# Patient Record
Sex: Female | Born: 1975 | Race: Black or African American | Hispanic: No | Marital: Single | Smoking: Current every day smoker
Health system: Southern US, Community
[De-identification: ages and names within clinical notes are randomized; demographics above are authoritative.]

## PROBLEM LIST (undated history)

## (undated) DIAGNOSIS — I10 Essential (primary) hypertension: Secondary | ICD-10-CM

## (undated) DIAGNOSIS — F32A Depression, unspecified: Secondary | ICD-10-CM

## (undated) DIAGNOSIS — E785 Hyperlipidemia, unspecified: Secondary | ICD-10-CM

## (undated) DIAGNOSIS — F419 Anxiety disorder, unspecified: Secondary | ICD-10-CM

## (undated) HISTORY — DX: Hyperlipidemia, unspecified: E78.5

## (undated) HISTORY — PX: DILATION AND CURETTAGE OF UTERUS: SHX78

## (undated) HISTORY — DX: Anxiety disorder, unspecified: F41.9

## (undated) HISTORY — DX: Depression, unspecified: F32.A

## (undated) HISTORY — DX: Essential (primary) hypertension: I10

---

## 2006-08-07 ENCOUNTER — Other Ambulatory Visit: Admission: RE | Admit: 2006-08-07 | Discharge: 2006-08-07 | Payer: Self-pay | Admitting: Obstetrics and Gynecology

## 2006-10-07 ENCOUNTER — Ambulatory Visit (HOSPITAL_COMMUNITY): Admission: RE | Admit: 2006-10-07 | Discharge: 2006-10-07 | Payer: Self-pay | Admitting: Obstetrics and Gynecology

## 2007-02-25 ENCOUNTER — Inpatient Hospital Stay (HOSPITAL_COMMUNITY): Admission: AD | Admit: 2007-02-25 | Discharge: 2007-02-25 | Payer: Self-pay | Admitting: Obstetrics and Gynecology

## 2007-03-14 ENCOUNTER — Encounter (INDEPENDENT_AMBULATORY_CARE_PROVIDER_SITE_OTHER): Payer: Self-pay | Admitting: Obstetrics and Gynecology

## 2007-03-14 ENCOUNTER — Inpatient Hospital Stay (HOSPITAL_COMMUNITY): Admission: AD | Admit: 2007-03-14 | Discharge: 2007-03-16 | Payer: Self-pay | Admitting: Obstetrics and Gynecology

## 2008-01-09 ENCOUNTER — Other Ambulatory Visit: Admission: RE | Admit: 2008-01-09 | Discharge: 2008-01-09 | Payer: Self-pay | Admitting: Obstetrics and Gynecology

## 2008-08-05 ENCOUNTER — Inpatient Hospital Stay (HOSPITAL_COMMUNITY): Admission: AD | Admit: 2008-08-05 | Discharge: 2008-08-07 | Payer: Self-pay | Admitting: Obstetrics and Gynecology

## 2009-01-05 ENCOUNTER — Other Ambulatory Visit: Admission: RE | Admit: 2009-01-05 | Discharge: 2009-01-05 | Payer: Self-pay | Admitting: Obstetrics and Gynecology

## 2010-10-03 LAB — CBC
MCHC: 33.4 g/dL (ref 30.0–36.0)
Platelets: 237 10*3/uL (ref 150–400)
RBC: 3.71 MIL/uL — ABNORMAL LOW (ref 3.87–5.11)
RDW: 14.8 % (ref 11.5–15.5)
WBC: 9 10*3/uL (ref 4.0–10.5)

## 2010-10-03 LAB — RPR: RPR Ser Ql: NONREACTIVE

## 2010-10-31 NOTE — H&P (Signed)
NAMECEOLA, PARA NO.:  [id number]   MEDICAL RECORD NO.:  [id number]           PATIENT TYPE:   LOCATION:                                FACILITY:  [facility]   PHYSICIAN:  Charles A. Delcambre, MDDATE OF BIRTH:  [birth year]   DATE OF ADMISSION:  08/05/2008  DATE OF DISCHARGE:                              HISTORY & PHYSICAL   This patient will be admitted on August 05, 2008 at 7:30 p.m for  Cytotec induction secondary to increasing estimated fetal weight and  elective nature.  EDC is August 06, 2008, confirmed with 14-week  ultrasound.  She is gravida 4, para 1-0-2-1.  Last baby was 8 pounds and  baby today on ultrasound measured large and ultrasound said 9 pounds 3  ounces, 4182 grams.   SURGICAL HISTORY:  D&C in the past, SVD.   MEDICATIONS:  Prenatal vitamins, iron.   ALLERGIES:  No known drug allergies.   SOCIAL HISTORY:  No tobacco, ethanol, drug use.  The patient married and  stays with her husband.   REVIEW OF SYSTEMS:  Denies contractions, ruptured membranes, or  bleeding.  She notes rapid fetal movement at this time.   PHYSICAL EXAMINATION:  HEART:  Regular rate and rhythm.  LUNGS:  Clear bilaterally.  ABDOMEN:  Gravid, fundal height is 45.  She will be 40 weeks on date of  induction , August 06, 2008, after Cytotec the evening before.  Ultrasound did show the baby to be 4182 grams.  First baby did suffer  from chronic aspiration syndrome and almost got to ECMO.  On physical  exam, cervix 1 cm posterior median, abdominal length 44 cm, having  contractions and baby pressing very much forward and at the time of this  measurement with the ultrasound is as noted above.  EXTREMITIES:  Moderate edema bilaterally.   ASSESSMENT:  1. Intrauterine pregnancy, 41 weeks.  2. Macrosomia.   PLAN:  Cytotec induction, if  unable, Pitocin thereafter.      Charles A. Sydnee Cabal, MD  Electronically Signed     CAD/MEDQ  D:  07/29/2008  T:  07/30/2008   Job:  [id number]

## 2010-10-31 NOTE — H&P (Signed)
NAMELORAL, CAMPI NO.:  [id number]   MEDICAL RECORD NO.:  [id number]          PATIENT TYPE:  INP   LOCATION:                                [facility]ACILITY:  [facility]   PHYSICIAN:  Charles A. Delcambre, MDDATE OF BIRTH:  [birth year]   DATE OF ADMISSION:  DATE OF DISCHARGE:                              HISTORY & PHYSICAL   This patient will be admitted on March 14, 2007, at 9:45 p.m. to  undergo Cervidil induction secondary to post dates.  She will be 10 days  overdue on date of delivery.  Prenatal care has been complicated by  first-trimester bleeding, bacterial vaginosis and anemia.  She is a 35-  year-old gravida 3, para 0-0-2-0; [facility] March 05, 2007.   PAST MEDICAL HISTORY:  None.   SURGICAL HISTORY:  D&C in 2003 for spontaneous abortion.  Spontaneous  abortion at 6 weeks in 1997, no D&C required.   MEDICATIONS:  1. Prenatal vitamins.  2. Tandem DHA.  3. Flagyl 500 b.i.d. in the past, Flagyl 2 g.  4. Iron once a day.   ALLERGIES:  No known drug allergies.   SOCIAL HISTORY:  Married in a monogamous relationship with her husband.  No tobacco at this time, alcohol or drug use.   REVIEW OF SYSTEMS:  Denies contractions, ruptured membranes or bleeding  at this time.  She notes active fetal movement.  No vision changes,  right upper quadrant pain, scotomata, or headache.   PHYSICAL EXAMINATION:  Alert and oriented x3, no distress.  Blood  pressure 122/70, weight 260 pounds, respirations 20, pulse 90.  HEENT:  Grossly within normal limits.  NECK:  Supple without thyromegaly or adenopathy.  LUNGS:  Clear bilaterally.  BREASTS:  No masses, tenderness, discharge, skin or nipple change  bilaterally.  HEART:  Regular rate and rhythm, 2/6 systolic ejection murmur left  sternal border.  Fundal height 40 cm.  PELVIC:  Cervix posterior, soft, minimally effaced.   ASSESSMENT:  Intrauterine pregnancy 10 days overdue, admitted now for  Cervidil induction.   PLAN:   Cervidil, then high-dose Pitocin in the morning.  Shower if not  in labor.  Breakfast okay if not in labor.  All questions are answered.  She will follow up as directed.      Charles A. Sydnee Cabal, MD  Electronically Signed     CAD/MEDQ  D:  03/06/2007  T:  03/06/2007  Job:  [id number]

## 2011-03-29 LAB — CBC
HCT: 24.6 — ABNORMAL LOW
HCT: 32.9 — ABNORMAL LOW
Hemoglobin: 11 — ABNORMAL LOW
Hemoglobin: 8.5 — ABNORMAL LOW
MCV: 87.1
RBC: 3.78 — ABNORMAL LOW
RDW: 16.1 — ABNORMAL HIGH
WBC: 10.2

## 2011-03-29 LAB — RPR: RPR Ser Ql: NONREACTIVE

## 2011-03-30 LAB — CBC
Hemoglobin: 10.2 — ABNORMAL LOW
MCHC: 34.1
RDW: 15.2 — ABNORMAL HIGH

## 2011-03-30 LAB — KLEIHAUER-BETKE STAIN: Quantitation Fetal Hemoglobin: 0

## 2014-10-16 ENCOUNTER — Encounter (HOSPITAL_COMMUNITY): Payer: Self-pay | Admitting: Emergency Medicine

## 2014-10-16 ENCOUNTER — Emergency Department (HOSPITAL_COMMUNITY)
Admission: EM | Admit: 2014-10-16 | Discharge: 2014-10-16 | Disposition: A | Discharge status: Home / Self Care | Payer: 59 | Source: Home / Self Care | Attending: Family Medicine | Admitting: Family Medicine

## 2014-10-16 DIAGNOSIS — L0231 Cutaneous abscess of buttock: Secondary | ICD-10-CM

## 2014-10-16 DIAGNOSIS — R21 Rash and other nonspecific skin eruption: Secondary | ICD-10-CM

## 2014-10-16 MED ORDER — SULFAMETHOXAZOLE-TRIMETHOPRIM 800-160 MG PO TABS: 2.0000 | ORAL_TABLET | Freq: Two times a day (BID) | ORAL | Status: AC

## 2014-10-16 NOTE — ED Notes (Signed)
Patient reports generalized, red rash to skin.  Appears on torso and extremities and itches.  Patient also has a draining wound to right buttocks.  Onset 4/26.  Has been using warm compresses and boileze.

## 2014-10-16 NOTE — Discharge Instructions (Signed)
Come back here in 2 days so we can reevaluate your medical condition and your rash.  If you start to get worse and develop symptoms including fever, vomiting, diarrhea, large blisters, or if the rash begins to involve your lips and mouth, or if you begin to feel very ill, go to the emergency department.  Abscess An abscess is an infected area that contains a collection of pus and debris.It can occur in almost any part of the body. An abscess is also known as a furuncle or boil. CAUSES  An abscess occurs when tissue gets infected. This can occur from blockage of oil or sweat glands, infection of hair follicles, or a minor injury to the skin. As the body tries to fight the infection, pus collects in the area and creates pressure under the skin. This pressure causes pain. People with weakened immune systems have difficulty fighting infections and get certain abscesses more often.  SYMPTOMS Usually an abscess develops on the skin and becomes a painful mass that is red, warm, and tender. If the abscess forms under the skin, you may feel a moveable soft area under the skin. Some abscesses break open (rupture) on their own, but most will continue to get worse without care. The infection can spread deeper into the body and eventually into the bloodstream, causing you to feel ill.  DIAGNOSIS  Your caregiver will take your medical history and perform a physical exam. A sample of fluid may also be taken from the abscess to determine what is causing your infection. TREATMENT  Your caregiver may prescribe antibiotic medicines to fight the infection. However, taking antibiotics alone usually does not cure an abscess. Your caregiver may need to make a small cut (incision) in the abscess to drain the pus. In some cases, gauze is packed into the abscess to reduce pain and to continue draining the area. HOME CARE INSTRUCTIONS   Only take over-the-counter or prescription medicines for pain, discomfort, or fever as  directed by your caregiver.  If you were prescribed antibiotics, take them as directed. Finish them even if you start to feel better.  If gauze is used, follow your caregiver's directions for changing the gauze.  To avoid spreading the infection:  Keep your draining abscess covered with a bandage.  Wash your hands well.  Do not share personal care items, towels, or whirlpools with others.  Avoid skin contact with others.  Keep your skin and clothes clean around the abscess.  Keep all follow-up appointments as directed by your caregiver. SEEK MEDICAL CARE IF:   You have increased pain, swelling, redness, fluid drainage, or bleeding.  You have muscle aches, chills, or a general ill feeling.  You have a fever. MAKE SURE YOU:   Understand these instructions.  Will watch your condition.  Will get help right away if you are not doing well or get worse. Document Released: 03/14/2005 Document Revised: 12/04/2011 Document Reviewed: 08/17/2011 [facility] Patient Information 2015 [place], [place]. This information is not intended to replace advice given to you by your health care provider. Make sure you discuss any questions you have with your health care provider.

## 2014-10-16 NOTE — ED Provider Notes (Signed)
CSN: [id number]     Arrival date & time 10/16/14  7078 History   First MD Initiated Contact with Patient 10/16/14 [phone]     Chief Complaint  Patient presents with  . Abscess  . Rash   (Consider location/radiation/quality/duration/timing/severity/associated sxs/prior Treatment) HPI      39 year old female presents for evaluation of an abscess on her right buttock. This started about 5 days ago. It has been draining for the past 3 days. Also she noted a rash 2 days ago. It is mildly itchy and spread out over her entire trunk. She denies any other symptoms including no fever, chills, NVD, abdominal pain, weakness, lightheadedness. The rash does not involve her hands or feet. There is no rash around her lips or mouth. Does not feel ill in any way.  History reviewed. No pertinent past medical history. Past Surgical History  Procedure Laterality Date  . Dilation and curettage of uterus     No family history on file. History  Substance Use Topics  . Smoking status: Current Every Day Smoker  . Smokeless tobacco: Not on file  . Alcohol Use: Yes   OB History    No data available     Review of Systems  Skin: Positive for rash.       Abscess of right buttock, draining  All other systems reviewed and are negative.   Allergies  Review of patient's allergies indicates no known allergies.  Home Medications   Prior to Admission medications   Medication Sig Start Date End Date Taking? Authorizing Provider  OVER THE COUNTER MEDICATION Boil eze   Yes Historical Provider, MD  sulfamethoxazole-trimethoprim (BACTRIM DS,SEPTRA DS) 800-160 MG per tablet Take 2 tablets by mouth 2 (two) times daily. 10/16/14 10/23/14  Freeman Caldron Siena Poehler, PA-C   BP 128/83 mmHg  Pulse 77  Temp(Src) 98.6 F (37 C) (Oral)  Resp 16  SpO2 99%  LMP 10/16/2014 Physical Exam  Constitutional: She is oriented to person, place, and time. Vital signs are normal. She appears well-developed and well-nourished. No distress.  HENT:   Head: Normocephalic and atraumatic.  Pulmonary/Chest: Effort normal. No respiratory distress.  Genitourinary:     Neurological: She is alert and oriented to person, place, and time. She has normal strength. Coordination normal.  Skin: Skin is warm and dry. Rash noted. Rash is macular (erythematous macular rash, symmetric on the entire trunk, blanches, with a slight scale. Negative nikoskys sign, and a vesicles or bulla). She is not diaphoretic.  Psychiatric: She has a normal mood and affect. Judgment normal.  Nursing note and vitals reviewed.   ED Course  Procedures (including critical care time) Labs Review Labs Reviewed - No data to display  Imaging Review No results found.   MDM   1. Abscess of buttock   2. Rash     Advised sitz baths for the abscess. Watchful waiting for the rash. She has no GI symptoms, no mucosal involvement. There is no bulla formation and Nikolsky is negative. Therefore, do not suspect toxic shock syndrome at this time. She will follow-up again in 2 days to recheck, strict return precautions were given that would prompt her to return to the emergency department.  Meds ordered this encounter  Medications  . OVER THE COUNTER MEDICATION    Sig: Boil eze  . sulfamethoxazole-trimethoprim (BACTRIM DS,SEPTRA DS) 800-160 MG per tablet    Sig: Take 2 tablets by mouth 2 (two) times daily.    Dispense:  40 tablet    Refill:  0    Order Specific Question:  Supervising Provider    Answer:  Ihor Gully D [[id number]]      Liam Graham, PA-C 10/16/14 1052

## 2014-10-18 ENCOUNTER — Emergency Department (HOSPITAL_COMMUNITY)
Admission: EM | Admit: 2014-10-18 | Discharge: 2014-10-18 | Disposition: A | Discharge status: Home / Self Care | Payer: 59 | Source: Home / Self Care | Attending: Family Medicine | Admitting: Family Medicine

## 2014-10-18 ENCOUNTER — Encounter (HOSPITAL_COMMUNITY): Payer: Self-pay | Admitting: Emergency Medicine

## 2014-10-18 DIAGNOSIS — L081 Erythrasma: Secondary | ICD-10-CM

## 2014-10-18 MED ORDER — CLINDAMYCIN PHOSPHATE 1 % EX GEL: Freq: Two times a day (BID) | CUTANEOUS | Status: DC

## 2014-10-18 MED ORDER — CLARITHROMYCIN 500 MG PO TABS: 1000.0000 mg | ORAL_TABLET | Freq: Once | ORAL | Status: DC

## 2014-10-18 NOTE — ED Provider Notes (Signed)
Lindsay Knox is a 39 y.o. female who presents to Urgent Care today for rash. Patient was seen 2 days ago for an abscess. At the same time of the abscess she had a rash on her trunk. She notes that the rash is progressed. She notes a scaly mildly itchy rash on her trunk and now on her extremities. No oral lesions fevers or chills nausea vomiting or diarrhea. The rash preceded the antibiotics that she is currently taking. The abscess feels much better.   History reviewed. No pertinent past medical history. Past Surgical History  Procedure Laterality Date  . Dilation and curettage of uterus     History  Substance Use Topics  . Smoking status: Current Every Day Smoker  . Smokeless tobacco: Not on file  . Alcohol Use: Yes   ROS as above Medications: No current facility-administered medications for this encounter.   Current Outpatient Prescriptions  Medication Sig Dispense Refill  . clarithromycin (BIAXIN) 500 MG tablet Take 2 tablets (1,000 mg total) by mouth once. 2 tablet 0  . clindamycin (CLINDAGEL) 1 % gel Apply topically 2 (two) times daily. 60 g 1  . OVER THE COUNTER MEDICATION Boil eze    . sulfamethoxazole-trimethoprim (BACTRIM DS,SEPTRA DS) 800-160 MG per tablet Take 2 tablets by mouth 2 (two) times daily. 40 tablet 0   No Known Allergies   Exam:  BP 129/54 mmHg  Pulse 70  Temp(Src) 98.7 F (37.1 C) (Oral)  Resp 16  SpO2 100%  LMP 10/16/2014 Gen: Well NAD Skin: Macular scaly rash on trunk and extremities. Mild hyperpigmented. Nontender.Coral colored illumination under Woods lamp.  No results found for this or any previous visit (from the past 24 hour(s)). No results found.  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 Treat with oral Biaxin 1 g once and clindamycin gel.  Discussed warning signs or symptoms. Please see discharge instructions. Patient expresses understanding.     Gregor Hams, MD 10/18/14 [phone]

## 2014-10-18 NOTE — ED Notes (Signed)
C/o rash States rash is all over body States she was seen here and was told to follow up

## 2014-10-18 NOTE — Discharge Instructions (Signed)
Thank you for coming in today. 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 Take the clarithromycin 2 tabs once. Use clindamycin gel on the rash. Return as needed Continue Bactrim

## 2017-12-24 ENCOUNTER — Ambulatory Visit: Payer: 59 | Admitting: Family Medicine

## 2017-12-24 ENCOUNTER — Encounter: Payer: Self-pay | Admitting: Family Medicine

## 2017-12-24 ENCOUNTER — Other Ambulatory Visit: Payer: Self-pay

## 2017-12-24 VITALS — BP 132/86 | HR 95 | Temp 98.0°F | Resp 16 | Ht 71.26 in | Wt 263.0 lb

## 2017-12-24 DIAGNOSIS — Z131 Encounter for screening for diabetes mellitus: Secondary | ICD-10-CM | POA: Diagnosis not present

## 2017-12-24 DIAGNOSIS — N912 Amenorrhea, unspecified: Secondary | ICD-10-CM

## 2017-12-24 DIAGNOSIS — Z124 Encounter for screening for malignant neoplasm of cervix: Secondary | ICD-10-CM

## 2017-12-24 DIAGNOSIS — Z0000 Encounter for general adult medical examination without abnormal findings: Secondary | ICD-10-CM | POA: Diagnosis not present

## 2017-12-24 DIAGNOSIS — Z13220 Encounter for screening for lipoid disorders: Secondary | ICD-10-CM

## 2017-12-24 DIAGNOSIS — E6609 Other obesity due to excess calories: Secondary | ICD-10-CM | POA: Diagnosis not present

## 2017-12-24 DIAGNOSIS — Z1231 Encounter for screening mammogram for malignant neoplasm of breast: Secondary | ICD-10-CM | POA: Diagnosis not present

## 2017-12-24 DIAGNOSIS — Z6836 Body mass index (BMI) 36.0-36.9, adult: Secondary | ICD-10-CM

## 2017-12-24 LAB — POCT WET + KOH PREP
Trich by wet prep: ABSENT
YEAST BY KOH: ABSENT
YEAST BY WET PREP: ABSENT

## 2017-12-24 MED ORDER — METRONIDAZOLE 500 MG PO TABS: 500.0000 mg | ORAL_TABLET | Freq: Two times a day (BID) | ORAL | 0 refills | Status: DC

## 2017-12-24 NOTE — Patient Instructions (Addendum)
  Z30.433 is the ICD [place] IUD REMOVAL AND REINSERTION Please check with [place] about coverage Do the cover the drug and do they cover the procedure   IF you received an x-ray today, you will receive an invoice from [facility]. Please contact [facility] at [phone] with questions or concerns regarding your invoice.   IF you received labwork today, you will receive an invoice from [facility]. Please contact [facility] at [phone] with questions or concerns regarding your invoice.   Our billing staff will not be able to assist you with questions regarding bills from these companies.  You will be contacted with the lab results as soon as they are available. The fastest way to get your results is to activate your My Chart account. Instructions are located on the last page of this paperwork. If you have not heard from [place] regarding the results in 2 weeks, please contact this office.     We recommend that you schedule a mammogram for breast cancer screening. Typically, you do not need a referral to do this. Please contact a local imaging center to schedule your mammogram.  [facility] - [phone]  *ask for the Radiology Department [facility] ([place]) - [phone] or [phone]  [facility] - [phone] [place] [phone] [facility] - [phone]  *ask for the [facility] - [phone]  *ask for the Radiology Department [facility] - [phone]  *ask for the [place] - [phone]

## 2017-12-24 NOTE — Progress Notes (Signed)
Chief Complaint  Patient presents with  . Establish Care    Subjective:  Lindsay Knox is a 42 y.o. female here for a health maintenance visit.  Patient is new pt  Pt reports that her IUD has been in place for 10 years She has no periods in over 8 years She states that she has no hot flashes or mood swings   She has not had a physical in a long time   There are no active problems to display for this patient.   History reviewed. No pertinent past medical history.  Past Surgical History:  Procedure Laterality Date  . DILATION AND CURETTAGE OF UTERUS       Outpatient Medications Prior to Visit  Medication Sig Dispense Refill  . clarithromycin (BIAXIN) 500 MG tablet Take 2 tablets (1,000 mg total) by mouth once. 2 tablet 0  . clindamycin (CLINDAGEL) 1 % gel Apply topically 2 (two) times daily. 60 g 1  . OVER THE COUNTER MEDICATION Boil eze     No facility-administered medications prior to visit.     No Known Allergies   Family History  Problem Relation Age of Onset  . Diabetes Father   . Hypertension Father   . Cancer Father        kidney cancer  . Diabetes Brother   . Hypertension Brother   . Hypertension Maternal Grandmother   . Stroke Maternal Grandmother   . Diabetes Paternal Grandmother   . Hypertension Paternal Grandmother      Health Habits: Dental Exam: up to date Eye Exam: up to date Exercise: 0 times/week on average Current exercise activities: walking/running Diet:   Social History   Socioeconomic History  . Marital status: Single    Spouse name: Not on file  . Number of children: Not on file  . Years of education: Not on file  . Highest education level: Not on file  Occupational History  . Not on file  Social Needs  . Financial resource strain: Not hard at all  . Food insecurity:    Worry: Never true    Inability: Never true  . Transportation needs:    Medical: No    Non-medical: No  Tobacco Use  . Smoking status: Current Every  Day Smoker    Packs/day: 0.50    Types: Cigarettes  . Smokeless tobacco: Never Used  Substance and Sexual Activity  . Alcohol use: Yes  . Drug use: No  . Sexual activity: Not on file  Lifestyle  . Physical activity:    Days per week: 0 days    Minutes per session: 0 min  . Stress: Very much  Relationships  . Social connections:    Talks on phone: More than three times a week    Gets together: Once a week    Attends religious service: Never    Active member of club or organization: No    Attends meetings of clubs or organizations: Never    Relationship status: Living with partner  . Intimate partner violence:    Fear of current or ex partner: No    Emotionally abused: No    Physically abused: No    Forced sexual activity: No  Other Topics Concern  . Not on file  Social History Narrative  . Not on file   Social History   Substance and Sexual Activity  Alcohol Use Yes   Social History   Tobacco Use  Smoking Status Current Every Day Smoker  . Packs/day:  0.50  . Types: Cigarettes  Smokeless Tobacco Never Used   Social History   Substance and Sexual Activity  Drug Use No    GYN: Sexual Health Menstrual status: regular menses LMP: No LMP recorded. (Menstrual status: IUD). Last pap smear: see HM section History of abnormal pap smears:  Sexually active: with female partner Current contraception: IUD  Health Maintenance: See under health Maintenance activity for review of completion dates as well. Immunization History  Administered Date(s) Administered  . Influenza,inj,Quad PF,6+ Mos 07/03/2017    Depression Screen-PHQ2/9 Depression screen PHQ 2/9 12/24/2017  Decreased Interest 0  Down, Depressed, Hopeless 0  PHQ - 2 Score 0      Depression Severity and Treatment Recommendations:  0-4= None  5-9= Mild / Treatment: Support, educate to call if worse; return in one month  10-14= Moderate / Treatment: Support, watchful waiting; Antidepressant or Psycotherapy   15-19= Moderately severe / Treatment: Antidepressant OR Psychotherapy  >= 20 = Major depression, severe / Antidepressant AND Psychotherapy    Review of Systems   ROS  See HPI for ROS as well.    Objective:   Vitals:   12/24/17 1110  BP: 132/86  Pulse: 95  Resp: 16  Temp: 98 F (36.7 C)  TempSrc: Oral  SpO2: 99%  Weight: 263 lb (119.3 kg)  Height: 5' 11.26" (1.81 m)    Body mass index is 36.41 kg/m.  Physical Exam  BP 132/86   Pulse 95   Temp 98 F (36.7 C) (Oral)   Resp 16   Ht 5' 11.26" (1.81 m)   Wt 263 lb (119.3 kg)   SpO2 99%   BMI 36.41 kg/m   Chaperone present General Appearance:    Alert, cooperative, no distress, appears stated age  Head:    Normocephalic, without obvious abnormality, atraumatic  Eyes:    PERRL, conjunctiva/corneas clear, EOM's intact, fundi    benign, both eyes  Ears:    Normal TM's and external ear canals, both ears  Nose:   Nares normal, septum midline, mucosa normal, no drainage    or sinus tenderness  Throat:   Lips, mucosa, and tongue normal; teeth and gums normal  Neck:   Supple, symmetrical, trachea midline, no adenopathy;    thyroid:  no enlargement/tenderness/nodules; no carotid   bruit or JVD  Back:     Symmetric, no curvature, ROM normal, no CVA tenderness  Lungs:     Clear to auscultation bilaterally, respirations unlabored  Chest Wall:    No tenderness or deformity   Heart:    Regular rate and rhythm, S1 and S2 normal, no murmur, rub   or gallop  Breast Exam:    No tenderness, masses, or nipple abnormality  Abdomen:     Soft, non-tender, bowel sounds active all four quadrants,    no masses, no organomegaly  Genitalia:    Normal female without lesion, scant white discharge without tenderness, pap performed, IUD string visualized, uterus nontender  Extremities:   Extremities normal, atraumatic, no cyanosis or edema  Pulses:   2+ and symmetric all extremities  Skin:   Skin color, texture, turgor normal, no rashes  or lesions  Lymph nodes:   Cervical, supraclavicular, and axillary nodes normal  Neurologic:   CNII-XII intact, normal strength, sensation and reflexes    throughout      Assessment/Plan:   Patient was seen for a health maintenance exam.  Counseled the patient on health maintenance issues. Reviewed her health mainteance schedule and ordered  appropriate tests (see orders.) Counseled on regular exercise and weight management. Recommend regular eye exams and dental cleaning.   The following issues were addressed today for health maintenance:   Lindsay Knox was seen today for establish care.  Diagnoses and all orders for this visit:  Encounter for health maintenance examination in adult-  Women's Health Maintenance Plan Advised monthly breast exam and annual mammogram Advised dental exam every six months Discussed stress management Discussed pap smear screening guidelines   -     POCT Wet + KOH Prep  Screening mammogram, encounter for -     MM Digital Screening; Future  Encounter for Papanicolaou smear for cervical cancer screening -     Pap IG, CT/NG NAA, and HPV (high risk)  Screening for diabetes mellitus -     Hemoglobin A1c  Amenorrhea- will check for hormonal causes since IUD has been in for 10 years Uncertain if it is still emitting hormones -     TSH -     FSH/LH  Screening, lipid -     Lipid panel -     Comprehensive metabolic panel  Class 2 obesity due to excess calories without serious comorbidity with body mass index (BMI) of 36.0 to 36.9 in adult -  Discussed regular exercise -     TSH  Other orders -     metroNIDAZOLE (FLAGYL) 500 MG tablet; Take 1 tablet (500 mg total) by mouth 2 (two) times daily.    No follow-ups on file.    Body mass index is 36.41 kg/m.:  Discussed the patient's BMI with patient. The BMI body mass index is 36.41 kg/m.     Future Appointments  Date Time Provider [place]  01/01/2018 10:40 AM Forrest Moron, MD  [facility]    Patient Instructions    [phone] is the ICD [place] IUD REMOVAL AND REINSERTION Please check with [place] about coverage Do the cover the drug and do they cover the procedure   IF you received an x-ray today, you will receive an invoice from [facility]. Please contact [facility] at [phone] with questions or concerns regarding your invoice.   IF you received labwork today, you will receive an invoice from [place]. Please contact [facility] at [phone] with questions or concerns regarding your invoice.   Our billing staff will not be able to assist you with questions regarding bills from these companies.  You will be contacted with the lab results as soon as they are available. The fastest way to get your results is to activate your My Chart account. Instructions are located on the last page of this paperwork. If you have not heard from [place] regarding the results in 2 weeks, please contact this office.     We recommend that you schedule a mammogram for breast cancer screening. Typically, you do not need a referral to do this. Please contact a local imaging center to schedule your mammogram.  [facility] - [phone]  *ask for the Radiology Department [facility] ([place] - [phone] or [phone]  [facility] - [phone] [place] [phone] [facility] - [phone]  *ask for the [facility] - [phone]  *ask for the Radiology Department [facility] - [phone]  *ask for the [place] - [phone]

## 2017-12-25 ENCOUNTER — Other Ambulatory Visit: Payer: Self-pay

## 2017-12-25 ENCOUNTER — Telehealth: Payer: Self-pay

## 2017-12-25 LAB — COMPREHENSIVE METABOLIC PANEL
ALT: 21 IU/L (ref 0–32)
AST: 15 IU/L (ref 0–40)
Albumin/Globulin Ratio: 1.7 (ref 1.2–2.2)
Albumin: 4.5 g/dL (ref 3.5–5.5)
Alkaline Phosphatase: 51 IU/L (ref 39–117)
BUN/Creatinine Ratio: 14 (ref 9–23)
BUN: 10 mg/dL (ref 6–24)
Bilirubin Total: 0.7 mg/dL (ref 0.0–1.2)
CALCIUM: 9.3 mg/dL (ref 8.7–10.2)
CO2: 24 mmol/L (ref 20–29)
CREATININE: 0.71 mg/dL (ref 0.57–1.00)
Chloride: 103 mmol/L (ref 96–106)
GFR, EST AFRICAN AMERICAN: 121 mL/min/{1.73_m2} (ref >59)
GFR, EST NON AFRICAN AMERICAN: 105 mL/min/{1.73_m2} (ref >59)
GLOBULIN, TOTAL: 2.6 g/dL (ref 1.5–4.5)
Glucose: 87 mg/dL (ref 65–99)
POTASSIUM: 4.3 mmol/L (ref 3.5–5.2)
SODIUM: 141 mmol/L (ref 134–144)
TOTAL PROTEIN: 7.1 g/dL (ref 6.0–8.5)

## 2017-12-25 LAB — HEMOGLOBIN A1C
Est. average glucose Bld gHb Est-mCnc: 120 mg/dL
HEMOGLOBIN A1C: 5.8 % — AB (ref 4.8–5.6)

## 2017-12-25 LAB — LIPID PANEL
CHOLESTEROL TOTAL: 189 mg/dL (ref 100–199)
Chol/HDL Ratio: 2.6 ratio (ref 0.0–4.4)
HDL: 73 mg/dL (ref >39)
LDL CALC: 91 mg/dL (ref 0–99)
TRIGLYCERIDES: 124 mg/dL (ref 0–149)
VLDL Cholesterol Cal: 25 mg/dL (ref 5–40)

## 2017-12-25 LAB — FSH/LH
FSH: 8 m[IU]/mL
LH: 9.9 m[IU]/mL

## 2017-12-25 LAB — TSH: TSH: 1.81 u[IU]/mL (ref 0.450–4.500)

## 2017-12-25 MED ORDER — METRONIDAZOLE 500 MG PO TABS: 500.0000 mg | ORAL_TABLET | Freq: Two times a day (BID) | ORAL | 0 refills | Status: DC

## 2017-12-25 NOTE — Telephone Encounter (Signed)
Call from pt.  Pharmacy does not have Rx for Flagyl. Advised pt that it would be resent.  07/09 Flagyl Rx noted as phone in.  Call to pharmacy, have not received Rx.  Resent as electronic.

## 2017-12-26 LAB — PAP IG, CT-NG NAA, HPV HIGH-RISK
Chlamydia, Nuc. Acid Amp: NEGATIVE
Gonococcus by Nucleic Acid Amp: NEGATIVE
HPV, high-risk: NEGATIVE
PAP SMEAR COMMENT: 0

## 2017-12-30 ENCOUNTER — Ambulatory Visit
Admission: RE | Admit: 2017-12-30 | Discharge: 2017-12-30 | Disposition: A | Discharge status: Home / Self Care | Payer: 59 | Source: Ambulatory Visit | Attending: Family Medicine | Admitting: Family Medicine

## 2017-12-30 DIAGNOSIS — Z1231 Encounter for screening mammogram for malignant neoplasm of breast: Secondary | ICD-10-CM | POA: Diagnosis not present

## 2018-01-01 ENCOUNTER — Other Ambulatory Visit: Payer: Self-pay

## 2018-01-01 ENCOUNTER — Encounter: Payer: Self-pay | Admitting: Family Medicine

## 2018-01-01 ENCOUNTER — Ambulatory Visit: Payer: 59 | Admitting: Family Medicine

## 2018-01-01 VITALS — BP 126/82 | HR 88 | Temp 99.0°F | Resp 17 | Ht 71.26 in | Wt 261.8 lb

## 2018-01-01 DIAGNOSIS — Z30433 Encounter for removal and reinsertion of intrauterine contraceptive device: Secondary | ICD-10-CM | POA: Diagnosis not present

## 2018-01-01 DIAGNOSIS — Z30430 Encounter for insertion of intrauterine contraceptive device: Secondary | ICD-10-CM

## 2018-01-01 DIAGNOSIS — N912 Amenorrhea, unspecified: Secondary | ICD-10-CM

## 2018-01-01 DIAGNOSIS — R7303 Prediabetes: Secondary | ICD-10-CM | POA: Diagnosis not present

## 2018-01-01 NOTE — Progress Notes (Signed)
Chief Complaint  Patient presents with  . follow up from 12/24/17 visit    HPI  Pt is here today to review labs and discuss her prediabetes She would like to remove and reinsert the Mirena IUD She like her IUD and has had it in for 10 years She has not had a period in 7-8 years Her LH/FSH are not in the menopausal range She denies hot flashes She does not desire any pregnancies GC/CT negative 12/24/17  No past medical history on file.  Current Outpatient Medications  Medication Sig Dispense Refill  . metroNIDAZOLE (FLAGYL) 500 MG tablet Take 1 tablet (500 mg total) by mouth 2 (two) times daily. 14 tablet 0   No current facility-administered medications for this visit.     Allergies: No Known Allergies  Past Surgical History:  Procedure Laterality Date  . DILATION AND CURETTAGE OF UTERUS      Social History   Socioeconomic History  . Marital status: Single    Spouse name: Not on file  . Number of children: Not on file  . Years of education: Not on file  . Highest education level: Not on file  Occupational History  . Not on file  Social Needs  . Financial resource strain: Not hard at all  . Food insecurity:    Worry: Never true    Inability: Never true  . Transportation needs:    Medical: No    Non-medical: No  Tobacco Use  . Smoking status: Current Every Day Smoker    Packs/day: 0.50    Types: Cigarettes  . Smokeless tobacco: Never Used  Substance and Sexual Activity  . Alcohol use: Yes  . Drug use: No  . Sexual activity: Not on file  Lifestyle  . Physical activity:    Days per week: 0 days    Minutes per session: 0 min  . Stress: Very much  Relationships  . Social connections:    Talks on phone: More than three times a week    Gets together: Once a week    Attends religious service: Never    Active member of club or organization: No    Attends meetings of clubs or organizations: Never    Relationship status: Living with partner  Other Topics  Concern  . Not on file  Social History Narrative  . Not on file    Family History  Problem Relation Age of Onset  . Diabetes Father   . Hypertension Father   . Cancer Father        kidney cancer  . Diabetes Brother   . Hypertension Brother   . Hypertension Maternal Grandmother   . Stroke Maternal Grandmother   . Diabetes Paternal Grandmother   . Hypertension Paternal Grandmother   . Leukemia Son   . Breast cancer Maternal Aunt 19     ROS Review of Systems See HPI Constitution: No fevers or chills No malaise No diaphoresis Skin: No rash or itching Eyes: no blurry vision, no double vision GU: no dysuria or hematuria Neuro: no dizziness or headaches all others reviewed and negative   Objective: Vitals:   01/01/18 1052  BP: 126/82  Pulse: 88  Resp: 17  Temp: 99 F (37.2 C)  TempSrc: Oral  SpO2: 99%  Weight: 261 lb 12.8 oz (118.8 kg)  Height: 5' 11.26" (1.81 m)    Physical Exam  Constitutional: She appears well-developed and well-nourished.  HENT:  Head: Normocephalic and atraumatic.  Eyes: Conjunctivae and EOM are normal.  Pulmonary/Chest: Effort normal.    Chaperone present Scant discharge noted on the cervix String visualized Cervix midline Uterus nontender  IUD removal and insertion note Written consent obtained. She understood risks of IUD placement to include bleeding, infection, uterine perforation, risk of expulsion, risk of failure < 1%, increased risk of ectopic pregnancy in the event of failure.   Prior to the procedure being performed, the patient (or guardian) was asked to state their full name, date of birth, type of procedure being performed and the exact location of the operative site. This information was then checked against the documentation in the patient's chart. Prior to the procedure being performed, a "time out" was performed by the physician that confirmed the correct patient, procedure and site.   A bimanual exam was performed  to establish the position of the uterus.  A speculum was placed in the vagina and the cervix was cleaned with Hibiclens.  A single tooth tenaculum was placed at the anterior lip of the cervix and the uterus was sounded to 8 cm.  Traction on the IUD string was performed to remove the previous IUD.  IUD removed intact.   The IUD was then carefully inserted into the uterus. The MIRENA device was released into the uterine cavity and the applicator withdrawn. The string was cut to the appropriate length.  The tenaculum was removed and the speculum withdrawn. The patient tolerated the procedure well.  Assessment and Plan Synthia was seen today for follow up from 12/24/17 visit.  Diagnoses and all orders for this visit:  Amenorrhea -   Discussed that she may continue to have amenorrhea if this is related to IUD which is most likely She does not desire additional work up  Encounter for IUD insertion Encounter for removal and reinsertion of intrauterine contraceptive device (IUD) IUD removed and reinserted without any concern or complication Follow up in one month for IUD check     [place]

## 2018-01-01 NOTE — Patient Instructions (Addendum)
   IF you received an x-ray today, you will receive an invoice from [facility]. Please contact [facility] at [phone] with questions or concerns regarding your invoice.   IF you received labwork today, you will receive an invoice from [facility]. Please contact [facility] at [phone] with questions or concerns regarding your invoice.   Our billing staff will not be able to assist you with questions regarding bills from these companies.  You will be contacted with the lab results as soon as they are available. The fastest way to get your results is to activate your My Chart account. Instructions are located on the last page of this paperwork. If you have not heard from us regarding the results in 2 weeks, please contact this office.     Intrauterine Device Insertion, Care After This sheet gives you information about how to care for yourself after your procedure. Your health care provider may also give you more specific instructions. If you have problems or questions, contact your health care provider. What can I expect after the procedure? After the procedure, it is common to have:  Cramps and pain in the abdomen.  Light bleeding (spotting) or heavier bleeding that is like your menstrual period. This may last for up to a few days.  Lower back pain.  Dizziness.  Headaches.  Nausea.  Follow these instructions at home:  Before resuming sexual activity, check to make sure that you can feel the IUD string(s). You should be able to feel the end of the string(s) below the opening of your cervix. If your IUD string is in place, you may resume sexual activity. ? If you had a hormonal IUD inserted more than 7 days after your most recent period started, you will need to use a backup method of birth control for 7 days after IUD insertion. Ask your health care provider whether this applies to you.  Continue to check that the IUD is still in place by feeling for the  string(s) after every menstrual period, or once a month.  Take over-the-counter and prescription medicines only as told by your health care provider.  Do not drive or use heavy machinery while taking prescription pain medicine.  Keep all follow-up visits as told by your health care provider. This is important. Contact a health care provider if:  You have bleeding that is heavier or lasts longer than a normal menstrual cycle.  You have a fever.  You have cramps or abdominal pain that get worse or do not get better with medicine.  You develop abdominal pain that is new or is not in the same area of earlier cramping and pain.  You feel lightheaded or weak.  You have abnormal or bad-smelling discharge from your vagina.  You have pain during sexual activity.  You have any of the following problems with your IUD string(s): ? The string bothers or hurts you or your sexual partner. ? You cannot feel the string. ? The string has gotten longer.  You can feel the IUD in your vagina.  You think you may be pregnant, or you miss your menstrual period.  You think you may have an STI (sexually transmitted infection). Get help right away if:  You have flu-like symptoms.  You have a fever and chills.  You can feel that your IUD has slipped out of place. Summary  After the procedure, it is common to have cramps and pain in the abdomen. It is also common to have light bleeding (  spotting) or heavier bleeding that is like your menstrual period.  Continue to check that the IUD is still in place by feeling for the string(s) after every menstrual period, or once a month.  Keep all follow-up visits as told by your health care provider. This is important.  Contact your health care provider if you have problems with your IUD string(s), such as the string getting longer or bothering you or your sexual partner. This information is not intended to replace advice given to you by your health care  provider. Make sure you discuss any questions you have with your health care provider. Document Released: 01/31/2011 Document Revised: 04/25/2016 Document Reviewed: 04/25/2016 Elsevier Interactive Patient Education  2017 Elsevier Inc.  

## 2018-02-01 ENCOUNTER — Encounter: Payer: Self-pay | Admitting: Family Medicine

## 2018-02-01 ENCOUNTER — Ambulatory Visit: Payer: 59 | Admitting: Family Medicine

## 2018-02-01 ENCOUNTER — Other Ambulatory Visit: Payer: Self-pay

## 2018-02-01 VITALS — BP 147/89 | HR 80 | Temp 98.9°F | Ht 72.0 in | Wt 266.4 lb

## 2018-02-01 DIAGNOSIS — N898 Other specified noninflammatory disorders of vagina: Secondary | ICD-10-CM | POA: Diagnosis not present

## 2018-02-01 LAB — POCT WET + KOH PREP
TRICH BY WET PREP: ABSENT
YEAST BY KOH: ABSENT
YEAST BY WET PREP: ABSENT

## 2018-02-01 MED ORDER — FLUCONAZOLE 150 MG PO TABS: 150.0000 mg | ORAL_TABLET | Freq: Once | ORAL | 1 refills | Status: AC

## 2018-02-01 MED ORDER — METRONIDAZOLE 500 MG PO TABS: 500.0000 mg | ORAL_TABLET | Freq: Two times a day (BID) | ORAL | 0 refills | Status: DC

## 2018-02-01 NOTE — Progress Notes (Signed)
Chief Complaint  Patient presents with  . Vaginitis    vaginal irritation for a couple of days. Also wants IUD checked    HPI   Pt reports burning and vaginal irritation Scant discharge Wants to get IUD check No back pain No hematuria 4 review of systems  History reviewed. No pertinent past medical history.  Current Outpatient Medications  Medication Sig Dispense Refill  . metroNIDAZOLE (FLAGYL) 500 MG tablet Take 1 tablet (500 mg total) by mouth 2 (two) times daily. 14 tablet 0   No current facility-administered medications for this visit.     Allergies: No Known Allergies  Past Surgical History:  Procedure Laterality Date  . DILATION AND CURETTAGE OF UTERUS      Social History   Socioeconomic History  . Marital status: Single    Spouse name: Not on file  . Number of children: Not on file  . Years of education: Not on file  . Highest education level: Not on file  Occupational History  . Not on file  Social Needs  . Financial resource strain: Not hard at all  . Food insecurity:    Worry: Never true    Inability: Never true  . Transportation needs:    Medical: No    Non-medical: No  Tobacco Use  . Smoking status: Current Every Day Smoker    Packs/day: 0.50    Types: Cigarettes  . Smokeless tobacco: Never Used  Substance and Sexual Activity  . Alcohol use: Yes  . Drug use: No  . Sexual activity: Yes    Birth control/protection: IUD  Lifestyle  . Physical activity:    Days per week: 0 days    Minutes per session: 0 min  . Stress: Very much  Relationships  . Social connections:    Talks on phone: More than three times a week    Gets together: Once a week    Attends religious service: Never    Active member of club or organization: No    Attends meetings of clubs or organizations: Never    Relationship status: Living with partner  Other Topics Concern  . Not on file  Social History Narrative  . Not on file    Family History  Problem  Relation Age of Onset  . Diabetes Father   . Hypertension Father   . Cancer Father        kidney cancer  . Diabetes Brother   . Hypertension Brother   . Hypertension Maternal Grandmother   . Stroke Maternal Grandmother   . Diabetes Paternal Grandmother   . Hypertension Paternal Grandmother   . Leukemia Son   . Breast cancer Maternal Aunt 50     ROS Review of Systems See HPI Constitution: No fevers or chills No malaise No diaphoresis Skin: No rash or itching Eyes: no blurry vision, no double vision GU: see hpi Neuro: no dizziness or headaches all others reviewed and negative   Objective: Vitals:   02/01/18 1315  BP: (!) 147/89  Pulse: 80  Temp: 98.9 F (37.2 C)  TempSrc: Oral  SpO2: 99%  Weight: 266 lb 6.4 oz (120.8 kg)  Height: 6' (1.829 m)    Physical Exam  Constitutional: She appears well-developed and well-nourished.  HENT:  Head: Normocephalic and atraumatic.  Eyes: Conjunctivae and EOM are normal.  Pulmonary/Chest: Effort normal.    Vaginal exam- Chaperone Present Labia normal bilaterally without skin lesions Urethral meatus normal appearing without erythema Vagina with scant discharge No CMT, ovaries  small and not palpable Uterus midline, nontender IUD string visualized    Wbc mod Clue cells+ Bact +  Assessment and Plan Lindsay Knox was seen today for vaginitis.  Diagnoses and all orders for this visit:  Vaginal discharge- BV noted -     POCT Wet + KOH Prep  Other orders -     metroNIDAZOLE (FLAGYL) 500 MG tablet; Take 1 tablet (500 mg total) by mouth 2 (two) times daily. -     fluconazole (DIFLUCAN) 150 MG tablet; Take 1 tablet (150 mg total) by mouth once for 1 dose. Repeat in 3 days     Sahas Sluka A Analia Zuk

## 2018-02-01 NOTE — Patient Instructions (Addendum)
  Kathlee Nations, MD Hampton Abbot, MD Maryelizabeth Rowan, MD    If you have lab work done today you will be contacted with your lab results within the next 2 weeks.  If you have not heard from [place] then please contact us. The fastest way to get your results is to register for My Chart.   IF you received an x-ray today, you will receive an invoice from [facility]. Please contact [facility] at [phone] with questions or concerns regarding your invoice.   IF you received labwork today, you will receive an invoice from [place]. Please contact [facility] at [phone] with questions or concerns regarding your invoice.   Our billing staff will not be able to assist you with questions regarding bills from these companies.  You will be contacted with the lab results as soon as they are available. The fastest way to get your results is to activate your My Chart account. Instructions are located on the last page of this paperwork. If you have not heard from [place] regarding the results in 2 weeks, please contact this office.

## 2019-01-08 IMAGING — MG DIGITAL SCREENING BILATERAL MAMMOGRAM WITH TOMO AND CAD
6 of 12 series · 6 of 36 positions shown · non-contrast
Comparison: None.

CLINICAL DATA: Screening.

EXAM:
DIGITAL SCREENING BILATERAL MAMMOGRAM WITH TOMO AND CAD

[R CC synth-2D]
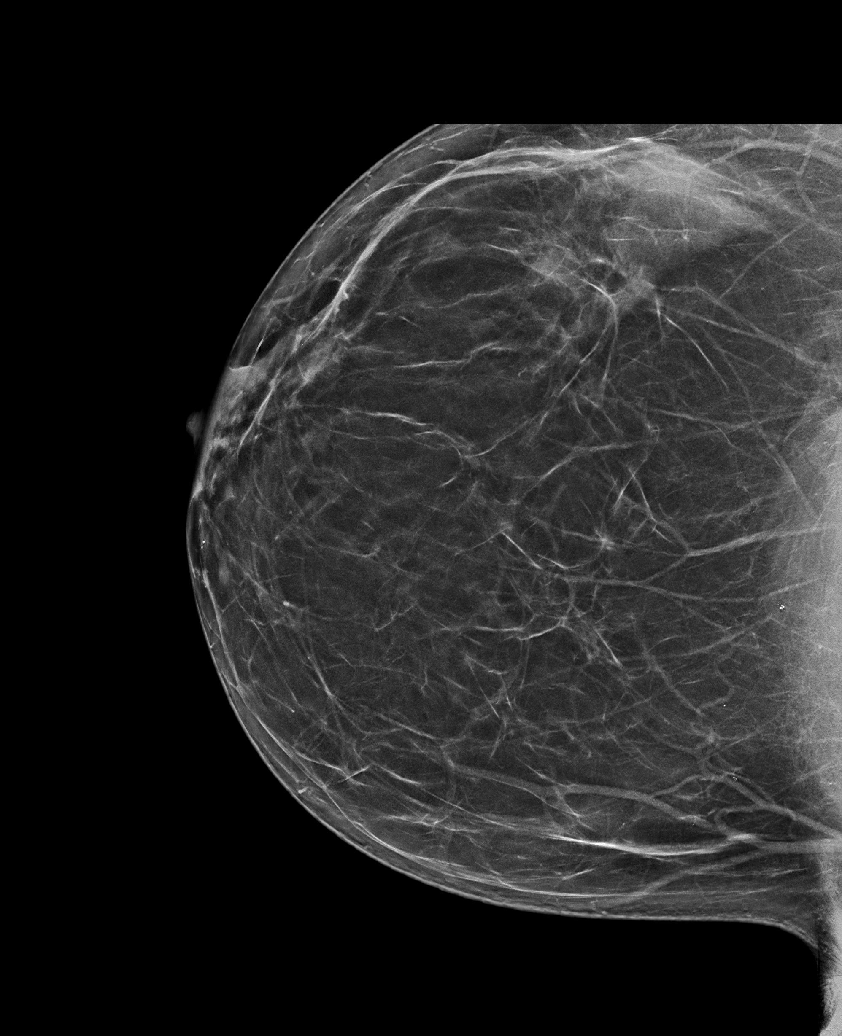

[R MLO synth-2D (1 of 2)]
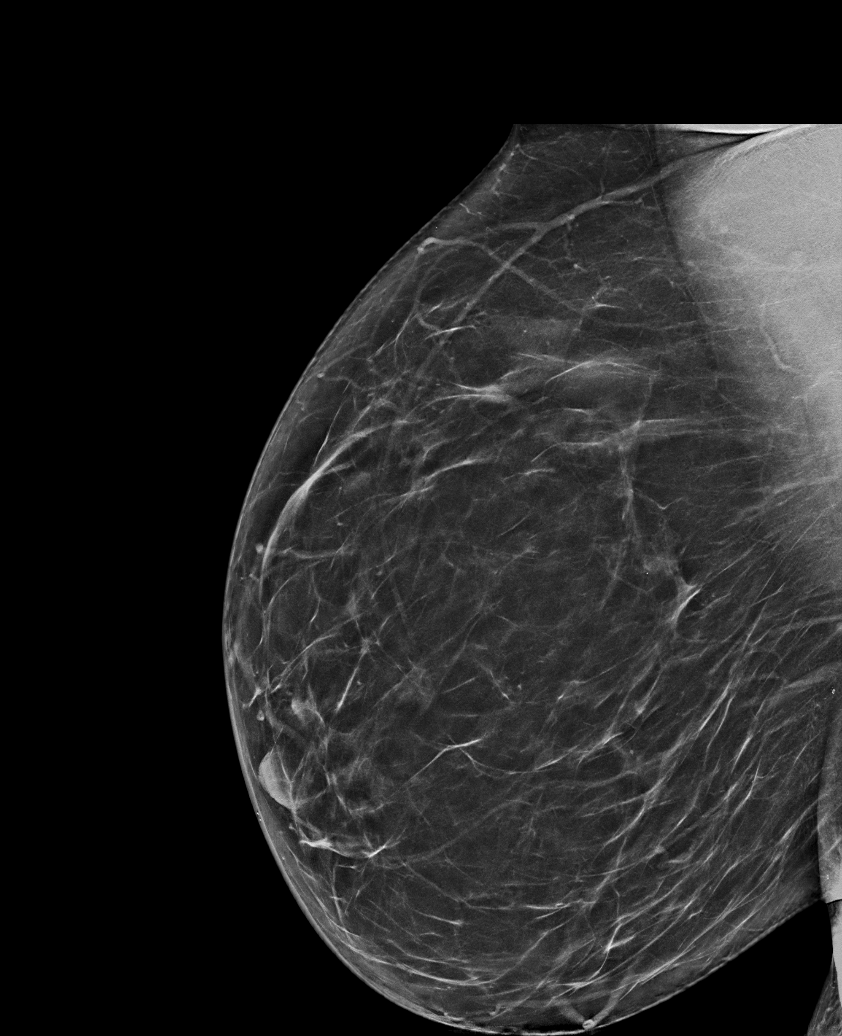

[L CC synth-2D]
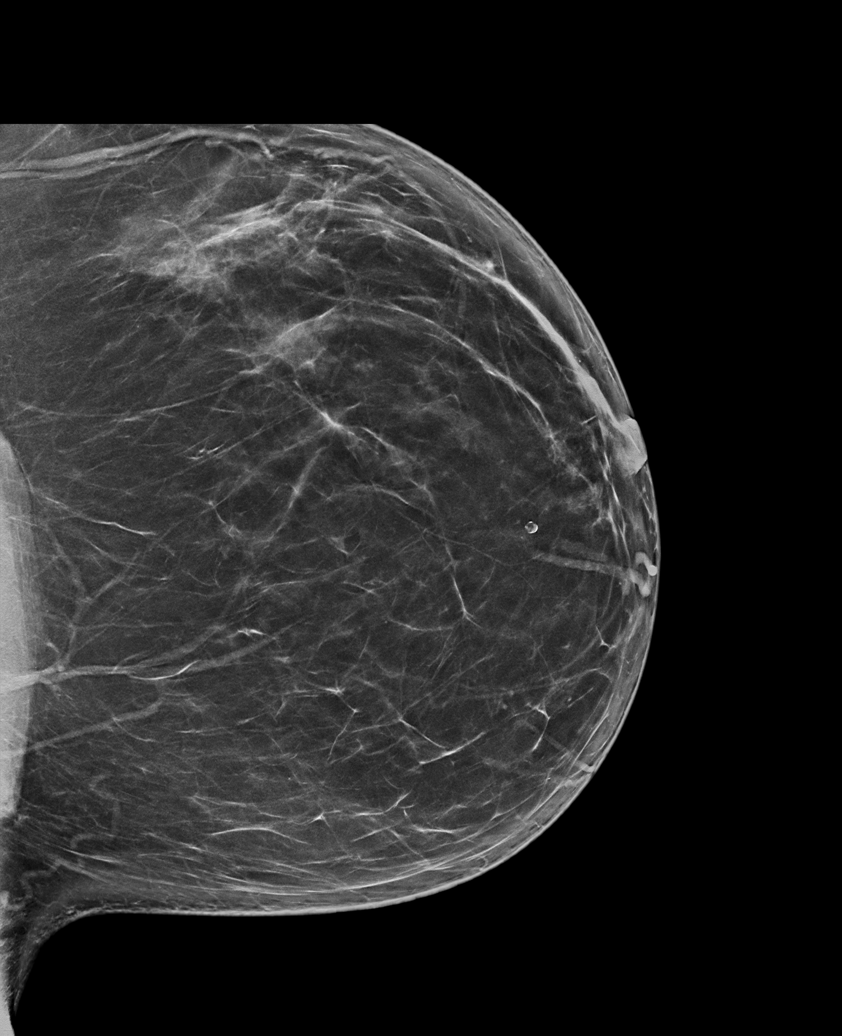

[L MLO synth-2D (1 of 2)]
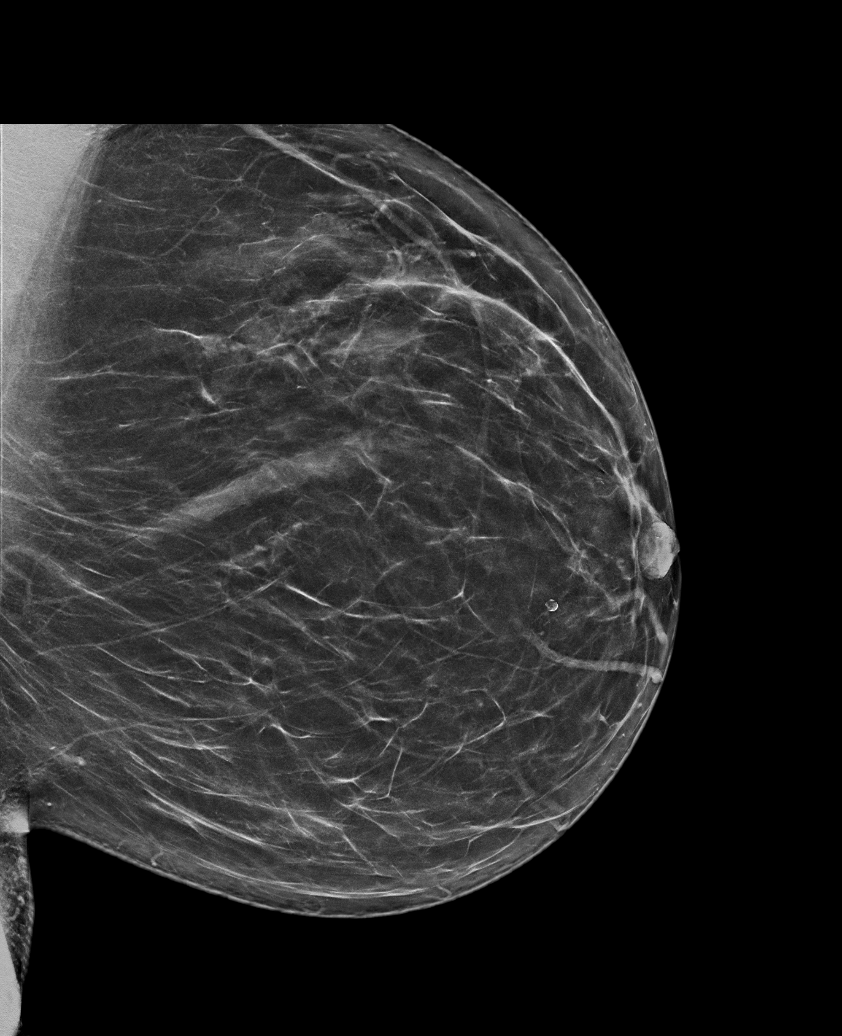

[L MLO synth-2D (2 of 2)]
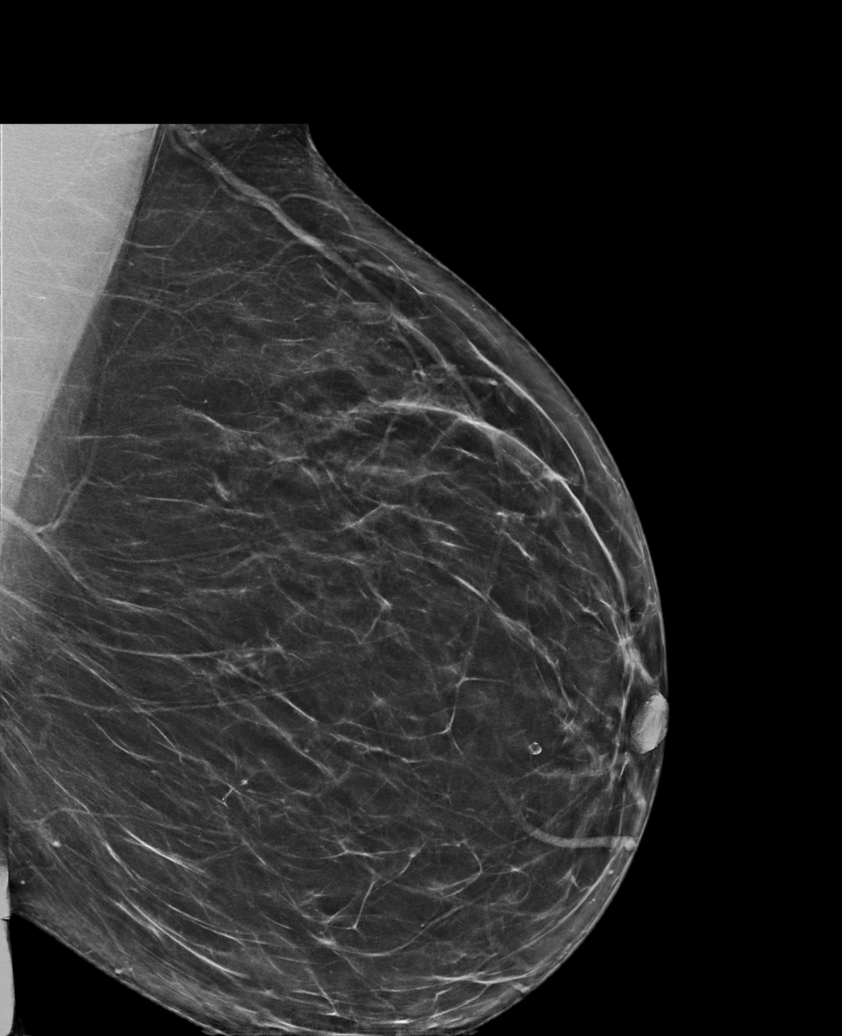

[R MLO synth-2D (2 of 2)]
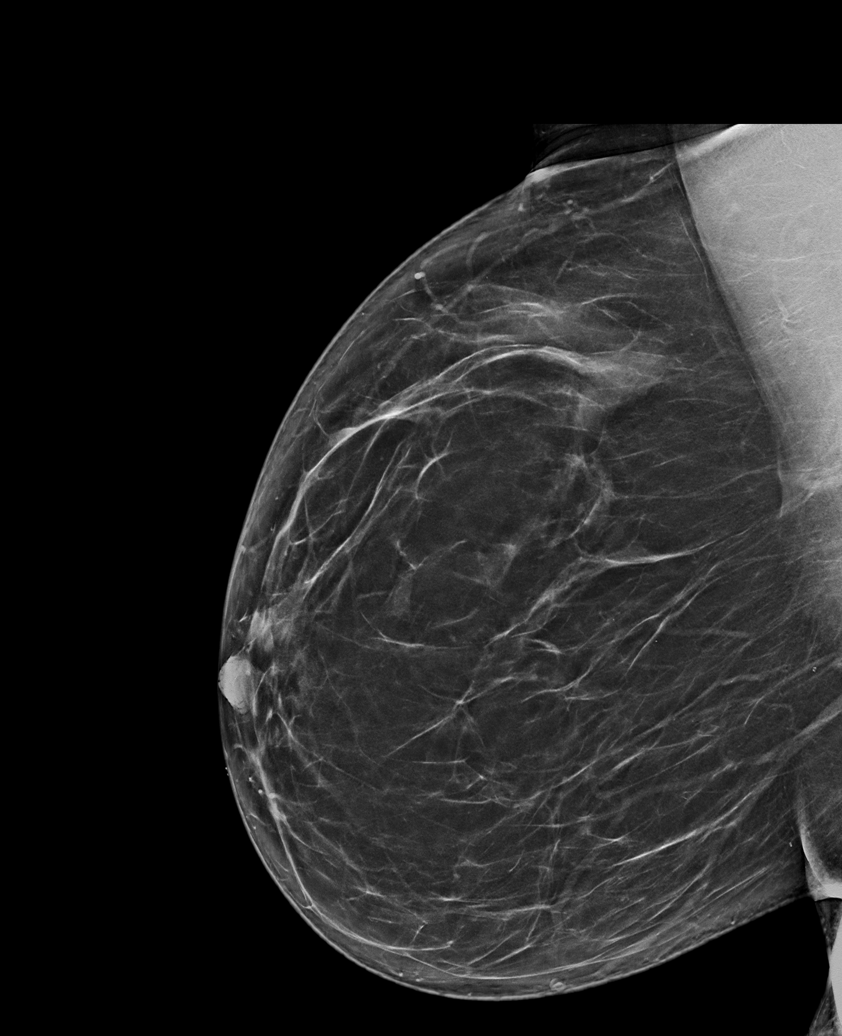

[6 of 36 positions shown; findings below may reference images not displayed]

ACR Breast Density Category b: There are scattered areas of
fibroglandular density.
FINDINGS: There are no findings suspicious for malignancy. Images were
processed with CAD.
IMPRESSION: No mammographic evidence of malignancy. A result letter of this
screening mammogram will be mailed directly to the patient.

RECOMMENDATION:
Screening mammogram in one year. (Code:[id number])

BI-RADS CATEGORY  1: Negative.

## 2019-12-22 ENCOUNTER — Encounter: Payer: Self-pay | Admitting: Family Medicine

## 2019-12-22 ENCOUNTER — Other Ambulatory Visit: Payer: Self-pay

## 2019-12-22 ENCOUNTER — Ambulatory Visit: Payer: 59 | Admitting: Family Medicine

## 2019-12-22 VITALS — BP 134/86 | HR 89 | Temp 97.4°F | Ht 72.0 in | Wt 259.4 lb

## 2019-12-22 DIAGNOSIS — R030 Elevated blood-pressure reading, without diagnosis of hypertension: Secondary | ICD-10-CM

## 2019-12-22 DIAGNOSIS — R7303 Prediabetes: Secondary | ICD-10-CM

## 2019-12-22 DIAGNOSIS — F17200 Nicotine dependence, unspecified, uncomplicated: Secondary | ICD-10-CM

## 2019-12-22 NOTE — Patient Instructions (Addendum)
   If you have lab work done today you will be contacted with your lab results within the next 2 weeks.  If you have not heard from us then please contact us. The fastest way to get your results is to register for My Chart.   IF you received an x-ray today, you will receive an invoice from [facility]. Please contact [facility] at [phone] with questions or concerns regarding your invoice.   IF you received labwork today, you will receive an invoice from [facility]. Please contact [facility] at [phone] with questions or concerns regarding your invoice.   Our billing staff will not be able to assist you with questions regarding bills from these companies.  You will be contacted with the lab results as soon as they are available. The fastest way to get your results is to activate your My Chart account. Instructions are located on the last page of this paperwork. If you have not heard from us regarding the results in 2 weeks, please contact this office.      DASH Eating Plan DASH stands for "Dietary Approaches to Stop Hypertension." The DASH eating plan is a healthy eating plan that has been shown to reduce high blood pressure (hypertension). It may also reduce your risk for type 2 diabetes, heart disease, and stroke. The DASH eating plan may also help with weight loss. What are tips for following this plan?  General guidelines  Avoid eating more than 2,300 mg (milligrams) of salt (sodium) a day. If you have hypertension, you may need to reduce your sodium intake to 1,500 mg a day.  Limit alcohol intake to no more than 1 drink a day for nonpregnant women and 2 drinks a day for men. One drink equals 12 oz of beer, 5 oz of wine, or 1 oz of hard liquor.  Work with your health care provider to maintain a healthy body weight or to lose weight. Ask what an ideal weight is for you.  Get at least 30 minutes of exercise that causes your heart to beat faster (aerobic  exercise) most days of the week. Activities may include walking, swimming, or biking.  Work with your health care provider or diet and nutrition specialist (dietitian) to adjust your eating plan to your individual calorie needs. Reading food labels   Check food labels for the amount of sodium per serving. Choose foods with less than 5 percent of the Daily Value of sodium. Generally, foods with less than 300 mg of sodium per serving fit into this eating plan.  To find whole grains, look for the word "whole" as the first word in the ingredient list. Shopping  Buy products labeled as "low-sodium" or "no salt added."  Buy fresh foods. Avoid canned foods and premade or frozen meals. Cooking  Avoid adding salt when cooking. Use salt-free seasonings or herbs instead of table salt or sea salt. Check with your health care provider or pharmacist before using salt substitutes.  Do not fry foods. Cook foods using healthy methods such as baking, boiling, grilling, and broiling instead.  Cook with heart-healthy oils, such as olive, canola, soybean, or sunflower oil. Meal planning  Eat a balanced diet that includes: ? 5 or more servings of fruits and vegetables each day. At each meal, try to fill half of your plate with fruits and vegetables. ? Up to 6-8 servings of whole grains each day. ? Less than 6 oz of lean meat, poultry, or fish each day. A 3-oz   serving of meat is about the same size as a deck of cards. One egg equals 1 oz. ? 2 servings of low-fat dairy each day. ? A serving of nuts, seeds, or beans 5 times each week. ? Heart-healthy fats. Healthy fats called Omega-3 fatty acids are found in foods such as flaxseeds and coldwater fish, like sardines, salmon, and mackerel.  Limit how much you eat of the following: ? Canned or prepackaged foods. ? Food that is high in trans fat, such as fried foods. ? Food that is high in saturated fat, such as fatty meat. ? Sweets, desserts, sugary drinks,  and other foods with added sugar. ? Full-fat dairy products.  Do not salt foods before eating.  Try to eat at least 2 vegetarian meals each week.  Eat more home-cooked food and less restaurant, buffet, and fast food.  When eating at a restaurant, ask that your food be prepared with less salt or no salt, if possible. What foods are recommended? The items listed may not be a complete list. Talk with your dietitian about what dietary choices are best for you. Grains Whole-grain or whole-wheat bread. Whole-grain or whole-wheat pasta. Brown rice. [place]. Bulgur. Whole-grain and low-sodium cereals. Pita bread. Low-fat, low-sodium crackers. Whole-wheat flour tortillas. Vegetables Fresh or frozen vegetables (raw, steamed, roasted, or grilled). Low-sodium or reduced-sodium tomato and vegetable juice. Low-sodium or reduced-sodium tomato sauce and tomato paste. Low-sodium or reduced-sodium canned vegetables. Fruits All fresh, dried, or frozen fruit. Canned fruit in natural juice (without added sugar). Meat and other protein foods Skinless chicken or [place]. Ground chicken or [place]. Pork with fat trimmed off. Fish and seafood. Egg whites. Dried beans, peas, or lentils. Unsalted nuts, nut butters, and seeds. Unsalted canned beans. Lean cuts of beef with fat trimmed off. Low-sodium, lean deli meat. Dairy Low-fat (1%) or fat-free (skim) milk. Fat-free, low-fat, or reduced-fat cheeses. Nonfat, low-sodium ricotta or cottage cheese. Low-fat or nonfat yogurt. Low-fat, low-sodium cheese. Fats and oils Soft margarine without trans fats. Vegetable oil. Low-fat, reduced-fat, or light mayonnaise and salad dressings (reduced-sodium). Canola, safflower, olive, soybean, and sunflower oils. Avocado. Seasoning and other foods Herbs. Spices. Seasoning mixes without salt. Unsalted popcorn and pretzels. Fat-free sweets. What foods are not recommended? The items listed may not be a complete list. Talk with your  dietitian about what dietary choices are best for you. Grains Baked goods made with fat, such as croissants, muffins, or some breads. Dry pasta or rice meal packs. Vegetables Creamed or fried vegetables. Vegetables in a cheese sauce. Regular canned vegetables (not low-sodium or reduced-sodium). Regular canned tomato sauce and paste (not low-sodium or reduced-sodium). Regular tomato and vegetable juice (not low-sodium or reduced-sodium). Angie Fava. Olives. Fruits Canned fruit in a light or heavy syrup. Fried fruit. Fruit in cream or butter sauce. Meat and other protein foods Fatty cuts of meat. Ribs. Fried meat. Berniece Salines. Sausage. Bologna and other processed lunch meats. Salami. Fatback. Hotdogs. Bratwurst. Salted nuts and seeds. Canned beans with added salt. Canned or smoked fish. Whole eggs or egg yolks. Chicken or [place] with skin. Dairy Whole or 2% milk, cream, and half-and-half. Whole or full-fat cream cheese. Whole-fat or sweetened yogurt. Full-fat cheese. Nondairy creamers. Whipped toppings. Processed cheese and cheese spreads. Fats and oils Butter. Stick margarine. Lard. Shortening. Ghee. Bacon fat. Tropical oils, such as coconut, palm kernel, or palm oil. Seasoning and other foods Salted popcorn and pretzels. Onion salt, garlic salt, seasoned salt, table salt, and sea salt. Worcestershire sauce. Tartar sauce. Barbecue  sauce. Teriyaki sauce. Soy sauce, including reduced-sodium. Steak sauce. Canned and packaged gravies. Fish sauce. Oyster sauce. Cocktail sauce. Horseradish that you find on the shelf. Ketchup. Mustard. Meat flavorings and tenderizers. Bouillon cubes. Hot sauce and Tabasco sauce. Premade or packaged marinades. Premade or packaged taco seasonings. Relishes. Regular salad dressings. Where to find more information:  National Heart, Lung, and [place]: https://wilson-eaton.com/  American Heart Association: www.heart.org Summary  The DASH eating plan is a healthy eating plan that has  been shown to reduce high blood pressure (hypertension). It may also reduce your risk for type 2 diabetes, heart disease, and stroke.  With the DASH eating plan, you should limit salt (sodium) intake to 2,300 mg a day. If you have hypertension, you may need to reduce your sodium intake to 1,500 mg a day.  When on the DASH eating plan, aim to eat more fresh fruits and vegetables, whole grains, lean proteins, low-fat dairy, and heart-healthy fats.  Work with your health care provider or diet and nutrition specialist (dietitian) to adjust your eating plan to your individual calorie needs. This information is not intended to replace advice given to you by your health care provider. Make sure you discuss any questions you have with your health care provider. Document Revised: 05/17/2017 Document Reviewed: 05/28/2016 Elsevier Patient Education  2020 [employer] with Quitting Smoking  Quitting smoking is a physical and mental challenge. You will face cravings, withdrawal symptoms, and temptation. Before quitting, work with your health care provider to make a plan that can help you cope. Preparation can help you quit and keep you from giving in. How can I cope with cravings? Cravings usually last for 5-10 minutes. If you get through it, the craving will pass. Consider taking the following actions to help you cope with cravings:  Keep your mouth busy: ? Chew sugar-free gum. ? Suck on hard candies or a straw. ? Brush your teeth.  Keep your hands and body busy: ? Immediately change to a different activity when you feel a craving. ? Squeeze or play with a ball. ? Do an activity or a hobby, like making bead jewelry, practicing needlepoint, or working with wood. ? Mix up your normal routine. ? Take a short exercise break. Go for a quick walk or run up and down stairs. ? Spend time in public places where smoking is not allowed.  Focus on doing something kind or helpful for someone  else.  Call a friend or family member to talk during a craving.  Join a support group.  Call a quit line, such as 1-800-QUIT-NOW.  Talk with your health care provider about medicines that might help you cope with cravings and make quitting easier for you. How can I deal with withdrawal symptoms? Your body may experience negative effects as it tries to get used to not having nicotine in the system. These effects are called withdrawal symptoms. They may include:  Feeling hungrier than normal.  Trouble concentrating.  Irritability.  Trouble sleeping.  Feeling depressed.  Restlessness and agitation.  Craving a cigarette. To manage withdrawal symptoms:  Avoid places, people, and activities that trigger your cravings.  Remember why you want to quit.  Get plenty of sleep.  Avoid coffee and other caffeinated drinks. These may worsen some of your symptoms. How can I handle social situations? Social situations can be difficult when you are quitting smoking, especially in the first few weeks. To manage this, you can:  Avoid parties, bars, and other  social situations where people might be smoking.  Avoid alcohol.  Leave right away if you have the urge to smoke.  Explain to your family and friends that you are quitting smoking. Ask for understanding and support.  Plan activities with friends or family where smoking is not an option. What are some ways I can cope with stress? Wanting to smoke may cause stress, and stress can make you want to smoke. Find ways to manage your stress. Relaxation techniques can help. For example:  Breathe slowly and deeply, in through your nose and out through your mouth.  Listen to soothing, relaxing music.  Talk with a family member or friend about your stress.  Light a candle.  Soak in a bath or take a shower.  Think about a peaceful place. What are some ways I can prevent weight gain? Be aware that many people gain weight after they quit  smoking. However, not everyone does. To keep from gaining weight, have a plan in place before you quit and stick to the plan after you quit. Your plan should include:  Having healthy snacks. When you have a craving, it may help to: ? Eat plain popcorn, crunchy carrots, celery, or other cut vegetables. ? Chew sugar-free gum.  Changing how you eat: ? Eat small portion sizes at meals. ? Eat 4-6 small meals throughout the day instead of 1-2 large meals a day. ? Be mindful when you eat. Do not watch television or do other things that might distract you as you eat.  Exercising regularly: ? Make time to exercise each day. If you do not have time for a long workout, do short bouts of exercise for 5-10 minutes several times a day. ? Do some form of strengthening exercise, like weight lifting, and some form of aerobic exercise, like running or swimming.  Drinking plenty of water or other low-calorie or no-calorie drinks. Drink 6-8 glasses of water daily, or as much as instructed by your health care provider. Summary  Quitting smoking is a physical and mental challenge. You will face cravings, withdrawal symptoms, and temptation to smoke again. Preparation can help you as you go through these challenges.  You can cope with cravings by keeping your mouth busy (such as by chewing gum), keeping your body and hands busy, and making calls to family, friends, or a helpline for people who want to quit smoking.  You can cope with withdrawal symptoms by avoiding places where people smoke, avoiding drinks with caffeine, and getting plenty of rest.  Ask your health care provider about the different ways to prevent weight gain, avoid stress, and handle social situations. This information is not intended to replace advice given to you by your health care provider. Make sure you discuss any questions you have with your health care provider. Document Revised: 05/17/2017 Document Reviewed: 06/01/2016 Elsevier  Patient Education  2020 [employer].

## 2019-12-22 NOTE — Progress Notes (Signed)
7/6/20214:18 PM  Lindsay Knox Oct 11, 1975, 44 y.o., female [id number]  Chief Complaint  Patient presents with  . Transitions Of Care  . Hypertension    HPI:   Patient is a 44 y.o. female who presents today for high BP  Last OV 2019 Her youngest son undergoing treatment for leukemia She has never been diagnosed with hypertension She occasionally checks at the store and at her uncle's house - she reports normal readings No strong fhx HTN or CAD She smokes 3 pack a week, smoking current for past 4 years Started smoking in her early 52s  Depression screen PHQ 2/9 12/22/2019 02/01/2018 01/01/2018  Decreased Interest 0 0 0  Down, Depressed, Hopeless 0 0 0  PHQ - 2 Score 0 0 0    Fall Risk  12/22/2019 02/01/2018 01/01/2018 12/24/2017  Falls in the past year? 0 No No No  Number falls in past yr: 0 - - -  Injury with Fall? 0 - - -  Follow up Falls evaluation completed - - -     No Known Allergies  Prior to Admission medications   Not on File    No past medical history on file.  Past Surgical History:  Procedure Laterality Date  . DILATION AND CURETTAGE OF UTERUS      Social History   Tobacco Use  . Smoking status: Current Every Day Smoker    Packs/day: 0.50    Types: Cigarettes  . Smokeless tobacco: Never Used  Substance Use Topics  . Alcohol use: Yes    Family History  Problem Relation Age of Onset  . Diabetes Father   . Hypertension Father   . Cancer Father        kidney cancer  . Diabetes Brother   . Hypertension Brother   . Hypertension Maternal Grandmother   . Stroke Maternal Grandmother   . Diabetes Paternal Grandmother   . Hypertension Paternal Grandmother   . Leukemia Son   . Breast cancer Maternal Aunt 50    Review of Systems  Constitutional: Negative for chills and fever.  Respiratory: Negative for cough and shortness of breath.   Cardiovascular: Negative for chest pain, palpitations and leg swelling.  Gastrointestinal: Negative for abdominal  pain, nausea and vomiting.     OBJECTIVE:  Today's Vitals   12/22/19 1536 12/22/19 1537  BP: (!) 178/108 134/86  Pulse: 89   Temp: (!) 97.4 F (36.3 C)   TempSrc: Temporal   SpO2: 98%   Weight: 259 lb 6.4 oz (117.7 kg)   Height: 6' (1.829 m)    Body mass index is 35.18 kg/m.  Wt Readings from Last 3 Encounters:  12/22/19 259 lb 6.4 oz (117.7 kg)  02/01/18 266 lb 6.4 oz (120.8 kg)  01/01/18 261 lb 12.8 oz (118.8 kg)    Physical Exam Vitals and nursing note reviewed.  Constitutional:      Appearance: She is well-developed.  HENT:     Head: Normocephalic and atraumatic.     Mouth/Throat:     Pharynx: No oropharyngeal exudate.  Eyes:     General: No scleral icterus.    Conjunctiva/sclera: Conjunctivae normal.     Pupils: Pupils are equal, round, and reactive to light.  Cardiovascular:     Rate and Rhythm: Normal rate and regular rhythm.     Heart sounds: Normal heart sounds. No murmur heard.  No friction rub. No gallop.   Pulmonary:     Effort: Pulmonary effort is normal.  Breath sounds: Normal breath sounds. No wheezing or rales.  Musculoskeletal:     Cervical back: Neck supple.  Skin:    General: Skin is warm and dry.  Neurological:     Mental Status: She is alert and oriented to person, place, and time.     No results found for this or any previous visit (from the past 24 hour(s)).  No results found.   ASSESSMENT and PLAN  1. Elevated BP without diagnosis of hypertension Repeat BP < 140/90, discussed LFM, including impact of smoking on BP and cardiac RF.  - Lipid panel - TSH  2. Heavy tobacco smoker Smoking cessation instruction/counseling given for greater than 10 minutes:  counseled patient on the dangers of tobacco use, advised patient to stop smoking, and reviewed strategies to maximize success - CBC  3. Prediabetes - Hemoglobin A1c - Comprehensive metabolic panel  Return in about 4 weeks (around 01/19/2020) for HTN/smoking.    Rutherford Guys, MD [facility] [place], [place] [address] Ph.  [phone] Fax [phone]

## 2019-12-23 LAB — CBC
Hematocrit: 43.5 % (ref 34.0–46.6)
Hemoglobin: 13.6 g/dL (ref 11.1–15.9)
MCH: 27.6 pg (ref 26.6–33.0)
MCHC: 31.3 g/dL — ABNORMAL LOW (ref 31.5–35.7)
MCV: 88 fL (ref 79–97)
Platelets: 245 10*3/uL (ref 150–450)
RBC: 4.92 x10E6/uL (ref 3.77–5.28)
RDW: 13 % (ref 11.7–15.4)
WBC: 9.3 10*3/uL (ref 3.4–10.8)

## 2019-12-23 LAB — COMPREHENSIVE METABOLIC PANEL
ALT: 13 IU/L (ref 0–32)
AST: 15 IU/L (ref 0–40)
Albumin/Globulin Ratio: 1.8 (ref 1.2–2.2)
Albumin: 4.5 g/dL (ref 3.8–4.8)
Alkaline Phosphatase: 55 IU/L (ref 48–121)
BUN/Creatinine Ratio: 15 (ref 9–23)
BUN: 11 mg/dL (ref 6–24)
Bilirubin Total: 0.4 mg/dL (ref 0.0–1.2)
CO2: 24 mmol/L (ref 20–29)
Calcium: 9.3 mg/dL (ref 8.7–10.2)
Chloride: 103 mmol/L (ref 96–106)
Creatinine, Ser: 0.74 mg/dL (ref 0.57–1.00)
GFR calc Af Amer: 113 mL/min/{1.73_m2} (ref >59)
GFR calc non Af Amer: 98 mL/min/{1.73_m2} (ref >59)
Globulin, Total: 2.5 g/dL (ref 1.5–4.5)
Glucose: 99 mg/dL (ref 65–99)
Potassium: 3.8 mmol/L (ref 3.5–5.2)
Sodium: 139 mmol/L (ref 134–144)
Total Protein: 7 g/dL (ref 6.0–8.5)

## 2019-12-23 LAB — TSH: TSH: 1.65 u[IU]/mL (ref 0.450–4.500)

## 2019-12-23 LAB — LIPID PANEL
Chol/HDL Ratio: 2.7 ratio (ref 0.0–4.4)
Cholesterol, Total: 204 mg/dL — ABNORMAL HIGH (ref 100–199)
HDL: 76 mg/dL (ref >39)
LDL Chol Calc (NIH): 99 mg/dL (ref 0–99)
Triglycerides: 170 mg/dL — ABNORMAL HIGH (ref 0–149)
VLDL Cholesterol Cal: 29 mg/dL (ref 5–40)

## 2019-12-23 LAB — HEMOGLOBIN A1C
Est. average glucose Bld gHb Est-mCnc: 108 mg/dL
Hgb A1c MFr Bld: 5.4 % (ref 4.8–5.6)

## 2020-01-08 LAB — HM MAMMOGRAPHY

## 2020-01-18 ENCOUNTER — Encounter: Payer: Self-pay | Admitting: Family Medicine

## 2020-01-19 ENCOUNTER — Other Ambulatory Visit: Payer: Self-pay

## 2020-01-19 ENCOUNTER — Encounter: Payer: Self-pay | Admitting: Family Medicine

## 2020-01-19 ENCOUNTER — Ambulatory Visit: Payer: 59 | Admitting: Family Medicine

## 2020-01-19 VITALS — BP 148/88 | HR 78 | Temp 98.1°F | Resp 16 | Ht 72.0 in | Wt 261.8 lb

## 2020-01-19 DIAGNOSIS — F17200 Nicotine dependence, unspecified, uncomplicated: Secondary | ICD-10-CM | POA: Diagnosis not present

## 2020-01-19 DIAGNOSIS — I10 Essential (primary) hypertension: Secondary | ICD-10-CM | POA: Diagnosis not present

## 2020-01-19 NOTE — Patient Instructions (Addendum)
150 minutes of aerobic exercise once a week  Managing Your Hypertension Hypertension is commonly called high blood pressure. This is when the force of your blood pressing against the walls of your arteries is too strong. Arteries are blood vessels that carry blood from your heart throughout your body. Hypertension forces the heart to work harder to pump blood, and may cause the arteries to become narrow or stiff. Having untreated or uncontrolled hypertension can cause heart attack, stroke, kidney disease, and other problems. What are blood pressure readings? A blood pressure reading consists of a higher number over a lower number. Ideally, your blood pressure should be below 120/80. The first ("top") number is called the systolic pressure. It is a measure of the pressure in your arteries as your heart beats. The second ("bottom") number is called the diastolic pressure. It is a measure of the pressure in your arteries as the heart relaxes. What does my blood pressure reading mean? Blood pressure is classified into four stages. Based on your blood pressure reading, your health care provider may use the following stages to determine what type of treatment you need, if any. Systolic pressure and diastolic pressure are measured in a unit called mm Hg. Normal  Systolic pressure: below 295.  Diastolic pressure: below 80. Elevated  Systolic pressure: 621-308.  Diastolic pressure: below 80. Hypertension stage 1  Systolic pressure: 657-846.  Diastolic pressure: 96-29. Hypertension stage 2  Systolic pressure: 528 or above.  Diastolic pressure: 90 or above. What health risks are associated with hypertension? Managing your hypertension is an important responsibility. Uncontrolled hypertension can lead to:  A heart attack.  A stroke.  A weakened blood vessel (aneurysm).  Heart failure.  Kidney damage.  Eye damage.  Metabolic syndrome.  Memory and concentration problems. What changes  can I make to manage my hypertension? Hypertension can be managed by making lifestyle changes and possibly by taking medicines. Your health care provider will help you make a plan to bring your blood pressure within a normal range. Eating and drinking   Eat a diet that is high in fiber and potassium, and low in salt (sodium), added sugar, and fat. An example eating plan is called the DASH (Dietary Approaches to Stop Hypertension) diet. To eat this way: ? Eat plenty of fresh fruits and vegetables. Try to fill half of your plate at each meal with fruits and vegetables. ? Eat whole grains, such as whole wheat pasta, brown rice, or whole grain bread. Fill about one quarter of your plate with whole grains. ? Eat low-fat diary products. ? Avoid fatty cuts of meat, processed or cured meats, and poultry with skin. Fill about one quarter of your plate with lean proteins such as fish, chicken without skin, beans, eggs, and tofu. ? Avoid premade and processed foods. These tend to be higher in sodium, added sugar, and fat.  Reduce your daily sodium intake. Most people with hypertension should eat less than 1,500 mg of sodium a day.  Limit alcohol intake to no more than 1 drink a day for nonpregnant women and 2 drinks a day for men. One drink equals 12 oz of beer, 5 oz of wine, or 1 oz of hard liquor. Lifestyle  Work with your health care provider to maintain a healthy body weight, or to lose weight. Ask what an ideal weight is for you.  Get at least 30 minutes of exercise that causes your heart to beat faster (aerobic exercise) most days of the week. Activities  may include walking, swimming, or biking.  Include exercise to strengthen your muscles (resistance exercise), such as weight lifting, as part of your weekly exercise routine. Try to do these types of exercises for 30 minutes at least 3 days a week.  Do not use any products that contain nicotine or tobacco, such as cigarettes and e-cigarettes. If  you need help quitting, ask your health care provider.  Control any long-term (chronic) conditions you have, such as high cholesterol or diabetes. Monitoring  Monitor your blood pressure at home as told by your health care provider. Your personal target blood pressure may vary depending on your medical conditions, your age, and other factors.  Have your blood pressure checked regularly, as often as told by your health care provider. Working with your health care provider  Review all the medicines you take with your health care provider because there may be side effects or interactions.  Talk with your health care provider about your diet, exercise habits, and other lifestyle factors that may be contributing to hypertension.  Visit your health care provider regularly. Your health care provider can help you create and adjust your plan for managing hypertension. Will I need medicine to control my blood pressure? Your health care provider may prescribe medicine if lifestyle changes are not enough to get your blood pressure under control, and if:  Your systolic blood pressure is 130 or higher.  Your diastolic blood pressure is 80 or higher. Take medicines only as told by your health care provider. Follow the directions carefully. Blood pressure medicines must be taken as prescribed. The medicine does not work as well when you skip doses. Skipping doses also puts you at risk for problems. Contact a health care provider if:  You think you are having a reaction to medicines you have taken.  You have repeated (recurrent) headaches.  You feel dizzy.  You have swelling in your ankles.  You have trouble with your vision. Get help right away if:  You develop a severe headache or confusion.  You have unusual weakness or numbness, or you feel faint.  You have severe pain in your chest or abdomen.  You vomit repeatedly.  You have trouble breathing. Summary  Hypertension is when the  force of blood pumping through your arteries is too strong. If this condition is not controlled, it may put you at risk for serious complications.  Your personal target blood pressure may vary depending on your medical conditions, your age, and other factors. For most people, a normal blood pressure is less than 120/80.  Hypertension is managed by lifestyle changes, medicines, or both. Lifestyle changes include weight loss, eating a healthy, low-sodium diet, exercising more, and limiting alcohol. This information is not intended to replace advice given to you by your health care provider. Make sure you discuss any questions you have with your health care provider. Document Revised: 09/26/2018 Document Reviewed: 05/02/2016 Elsevier Patient Education  [employer].     If you have lab work done today you will be contacted with your lab results within the next 2 weeks.  If you have not heard from [place] then please contact us. The fastest way to get your results is to register for My Chart.   IF you received an x-ray today, you will receive an invoice from [facility]. Please contact [facility] at [phone] with questions or concerns regarding your invoice.   IF you received labwork today, you will receive an invoice from [facility]. Please contact  [facility] at [phone] with questions or concerns regarding your invoice.   Our billing staff will not be able to assist you with questions regarding bills from these companies.  You will be contacted with the lab results as soon as they are available. The fastest way to get your results is to activate your My Chart account. Instructions are located on the last page of this paperwork. If you have not heard from [place] regarding the results in 2 weeks, please contact this office.

## 2020-01-19 NOTE — Progress Notes (Signed)
8/3/20214:00 PM  Lindsay Knox Nov 27, 1975, 44 y.o., female [id number]  Chief Complaint  Patient presents with  . Nicotine Dependence    pt has been doing 2 1/2 packs per week had a loss in her family this past weekend and admits to having more than she was supposed to at that time but has signed up for a quit coach has helped some  . Hypertension    pt has taken her BP a few times at home was 161/99 before her visit today pt expressed desire to bring her meter to compare to ours informed she can have a nurse visit to do this, pt denies physical symptoms     HPI:   Patient is a 44 y.o. female who presents today for elevated BP and tobacco use  Has been working on smoking cessation Has been working with quit coach Has decreased from 3 packs a week down to 2.5 packs a week Checking BP at home twice a day, this morning 160s Usually between 150-160s Has started to walk during her work breaks Mindful of salt intake  Depression screen [facility] 2/9 01/19/2020 12/22/2019 02/01/2018  Decreased Interest 0 0 0  Down, Depressed, Hopeless 0 0 0  PHQ - 2 Score 0 0 0    Fall Risk  01/19/2020 12/22/2019 02/01/2018 01/01/2018 12/24/2017  Falls in the past year? 0 0 No No No  Number falls in past yr: 0 0 - - -  Injury with Fall? 0 0 - - -  Risk for fall due to : No Fall Risks - - - -  Follow up Falls evaluation completed Falls evaluation completed - - -     No Known Allergies  Prior to Admission medications   Not on File    No past medical history on file.  Past Surgical History:  Procedure Laterality Date  . DILATION AND CURETTAGE OF UTERUS      Social History   Tobacco Use  . Smoking status: Current Every Day Smoker    Packs/day: 0.50    Types: Cigarettes  . Smokeless tobacco: Never Used  Substance Use Topics  . Alcohol use: Yes    Family History  Problem Relation Age of Onset  . Diabetes Father   . Hypertension Father   . Cancer Father        kidney cancer  . Diabetes Brother    . Hypertension Brother   . Hypertension Maternal Grandmother   . Stroke Maternal Grandmother   . Diabetes Paternal Grandmother   . Hypertension Paternal Grandmother   . Leukemia Son   . Breast cancer Maternal Aunt 50    Review of Systems  Constitutional: Negative for chills and fever.  Respiratory: Negative for cough and shortness of breath.   Cardiovascular: Negative for chest pain, palpitations and leg swelling.  Gastrointestinal: Negative for abdominal pain, nausea and vomiting.     OBJECTIVE:  Today's Vitals   01/19/20 1523 01/19/20 1527  BP: (!) 158/94 (!) 148/88  Pulse: 78   Resp: 16   Temp: 98.1 F (36.7 C)   TempSrc: Temporal   SpO2: 99%   Weight: 261 lb 12.8 oz (118.8 kg)   Height: 6' (1.829 m)    Body mass index is 35.51 kg/m.   Physical Exam Vitals and nursing note reviewed.  Constitutional:      Appearance: She is well-developed.  HENT:     Head: Normocephalic and atraumatic.  Eyes:     General: No scleral icterus.  Conjunctiva/sclera: Conjunctivae normal.     Pupils: Pupils are equal, round, and reactive to light.  Pulmonary:     Effort: Pulmonary effort is normal.  Musculoskeletal:     Cervical back: Neck supple.  Skin:    General: Skin is warm and dry.  Neurological:     Mental Status: She is alert and oriented to person, place, and time.     No results found for this or any previous visit (from the past 24 hour(s)).  No results found.   ASSESSMENT and PLAN  1. Essential hypertension, benign 2. Smoker Discussed treatment options. Patient want to continue working on [place] on her own and LFM in general. Discussed specific LFM for hypertension. More than 50% of this 30 minute visit was spent using motivational interview techniques to assist patient to move from contemplation to action.   Return in about 3 months (around 04/20/2020).    Rutherford Guys, MD Primary [facility] at [facility] [place], [place] [address] Ph.   [phone] Fax [phone]

## 2020-07-04 ENCOUNTER — Other Ambulatory Visit: Payer: Self-pay

## 2020-07-04 ENCOUNTER — Ambulatory Visit
Admission: EM | Admit: 2020-07-04 | Discharge: 2020-07-04 | Disposition: A | Discharge status: Home / Self Care | Payer: 59 | Attending: Emergency Medicine | Admitting: Emergency Medicine

## 2020-07-04 DIAGNOSIS — M791 Myalgia, unspecified site: Secondary | ICD-10-CM

## 2020-07-04 MED ORDER — NAPROXEN 500 MG PO TABS
500.0000 mg | ORAL_TABLET | Freq: Two times a day (BID) | ORAL | 0 refills | Status: DC
Start: 2020-07-04 — End: 2020-07-28

## 2020-07-04 MED ORDER — CYCLOBENZAPRINE HCL 5 MG PO TABS: 5.0000 mg | ORAL_TABLET | Freq: Two times a day (BID) | ORAL | 0 refills | Status: AC | PRN

## 2020-07-04 NOTE — Discharge Instructions (Signed)

## 2020-07-04 NOTE — ED Triage Notes (Signed)
Pt c/o stiff neck x2 days, denies injury. States last tylenol was 3am. States pain to even swallow.

## 2020-07-04 NOTE — ED Provider Notes (Signed)
[facility]    CSN: [id number] Arrival date & time: 07/04/20  1316      History   Chief Complaint Chief Complaint  Patient presents with  . Torticollis    HPI Lindsay Knox is a 45 y.o. female  Presenting for right-sided neck pain and stiffness x2 days.  No fever, trauma, spasms, numbness or deformity.  Taken Tylenol without relief.  History reviewed. No pertinent past medical history.  There are no problems to display for this patient.   Past Surgical History:  Procedure Laterality Date  . DILATION AND CURETTAGE OF UTERUS      OB History   No obstetric history on file.      Home Medications    Prior to Admission medications   Medication Sig Start Date End Date Taking? Authorizing Provider  cyclobenzaprine (FLEXERIL) 5 MG tablet Take 1 tablet (5 mg total) by mouth 2 (two) times daily as needed for up to 10 days for muscle spasms. 07/04/20 07/14/20 Yes Hall-Potvin, [place], PA-C  naproxen (NAPROSYN) 500 MG tablet Take 1 tablet (500 mg total) by mouth 2 (two) times daily. 07/04/20  Yes Hall-Potvin, [place], PA-C    Family History Family History  Problem Relation Age of Onset  . Diabetes Father   . Hypertension Father   . Cancer Father        kidney cancer  . Diabetes Brother   . Hypertension Brother   . Hypertension Maternal Grandmother   . Stroke Maternal Grandmother   . Diabetes Paternal Grandmother   . Hypertension Paternal Grandmother   . Leukemia Son   . Breast cancer Maternal Aunt 42    Social History Social History   Tobacco Use  . Smoking status: Current Every Day Smoker    Packs/day: 0.50    Types: Cigarettes  . Smokeless tobacco: Never Used  Vaping Use  . Vaping Use: Never used  Substance Use Topics  . Alcohol use: Yes  . Drug use: No     Allergies   Patient has no known allergies.   Review of Systems Review of Systems  Constitutional: Negative for fatigue and fever.  HENT: Negative for ear pain, sinus pain,  sore throat and voice change.   Eyes: Negative for pain, redness and visual disturbance.  Respiratory: Negative for cough and shortness of breath.   Cardiovascular: Negative for chest pain and palpitations.  Gastrointestinal: Negative for abdominal pain, diarrhea and vomiting.  Musculoskeletal: Positive for neck pain and neck stiffness. Negative for arthralgias, back pain, gait problem and myalgias.  Skin: Negative for rash and wound.  Neurological: Negative for syncope and headaches.     Physical Exam Triage Vital Signs ED Triage Vitals [07/04/20 1411]  Enc Vitals Group     BP (!) 169/115     Pulse Rate 78     Resp 18     Temp 98.1 F (36.7 C)     Temp Source Oral     SpO2 98 %     Weight      Height      Head Circumference      Peak Flow      Pain Score 8     Pain Loc      Pain Edu?      Excl. in [place]?    No data found.  Updated Vital Signs BP (!) 169/115 (BP Location: Left Arm)   Pulse 78   Temp 98.1 F (36.7 C) (Oral)   Resp 18  SpO2 98%   Visual Acuity Right Eye Distance:   Left Eye Distance:   Bilateral Distance:    Right Eye Near:   Left Eye Near:    Bilateral Near:     Physical Exam Constitutional:      General: She is not in acute distress. HENT:     Head: Normocephalic and atraumatic.  Eyes:     General: No scleral icterus.    Pupils: Pupils are equal, round, and reactive to light.  Cardiovascular:     Rate and Rhythm: Normal rate.  Pulmonary:     Effort: Pulmonary effort is normal.  Musculoskeletal:        General: Tenderness present. No swelling or deformity.     Comments: Right trapezius tenderness and stiffness.  Slightly decreased ROM due to SCM tenderness when turning head to right.  No crepitus  Skin:    General: Skin is warm.     Capillary Refill: Capillary refill takes less than 2 seconds.     Coloration: Skin is not jaundiced or pale.     Findings: No bruising, erythema or rash.  Neurological:     General: No focal deficit  present.     Mental Status: She is alert and oriented to person, place, and time.      UC Treatments / Results  Labs (all labs ordered are listed, but only abnormal results are displayed) Labs Reviewed - No data to display  EKG   Radiology No results found.  Procedures Procedures (including critical care time)  Medications Ordered in UC Medications - No data to display  Initial Impression / Assessment and Plan / UC Course  I have reviewed the triage vital signs and the nursing notes.  Pertinent labs & imaging results that were available during my care of the patient were reviewed by me and considered in my medical decision making (see chart for details).     Afebrile, nontoxic, without neurocognitive deficit.  Right superior trapezius tension and tenderness.  We will treat supportively as below.  Follow-up with PCP regarding blood pressure.  Return precautions discussed, pt verbalized understanding and is agreeable to plan. Final Clinical Impressions(s) / UC Diagnoses   Final diagnoses:  Muscle tension pain     Discharge Instructions     Heat therapy (hot compress, warm wash rag, hot showers, etc.) can help relax muscles and soothe muscle aches. Cold therapy (ice packs) can be used to help swelling both after injury and after prolonged use of areas of chronic pain/aches.  Pain medication:  500 mg Naprosyn/Aleve (naproxen) every 12 hours with food:  AVOID other NSAIDs while taking this (may have Tylenol).  May take muscle relaxer as needed for severe pain / spasm.  (This medication may cause you to become tired so it is important you do not drink alcohol or operate heavy machinery while on this medication.  Recommend your first dose to be taken before bedtime to monitor for side effects safely)  Important to follow up with specialist(s) below for further evaluation/management if your symptoms persist or worsen.    ED Prescriptions    Medication Sig Dispense Auth.  Provider   cyclobenzaprine (FLEXERIL) 5 MG tablet Take 1 tablet (5 mg total) by mouth 2 (two) times daily as needed for up to 10 days for muscle spasms. 14 tablet Hall-Potvin, [place], PA-C   naproxen (NAPROSYN) 500 MG tablet Take 1 tablet (500 mg total) by mouth 2 (two) times daily. 30 tablet Hall-Potvin, [place], [place]  I have reviewed the PDMP during this encounter.   Hall-Potvin, [place], [place] 07/04/20 1515

## 2020-07-07 ENCOUNTER — Telehealth: Payer: 59 | Admitting: Emergency Medicine

## 2020-07-07 DIAGNOSIS — R21 Rash and other nonspecific skin eruption: Secondary | ICD-10-CM | POA: Diagnosis not present

## 2020-07-07 NOTE — Progress Notes (Signed)
E Visit for Rash  We are sorry that you are not feeling well. Here is how we plan to help!    Based on what you shared with me you may have a virus or an allergic reaction.  Avoid contact with pregnant women until a diagnosis is made.  Most viral rashes are contagious (especially if a fever is present).  You can return to work or school after the rash is gone or when your doctor says it is safe to return with the rash.    This could easily be from the muscle relaxer.  Discontinue using it.  Use 25mg  of Benadryl every 6 hours until your symptoms resolve.  If you have shortness of breath, trouble swallowing, blister formation, or any other symptoms that are worse, go to the ER.   HOME CARE:   Take cool showers and avoid direct sunlight.  Apply cool compress or wet dressings.  Take a bath in an oatmeal bath.  Sprinkle content of one Aveeno packet under running faucet with comfortably warm water.  Bathe for 15-20 minutes, 1-2 times daily.  Pat dry with a towel. Do not rub the rash.  Use hydrocortisone cream.  Take an antihistamine like Benadryl for widespread rashes that itch.  The adult dose of Benadryl is 25-50 mg by mouth 4 times daily.  Caution:  This type of medication may cause sleepiness.  Do not drink alcohol, drive, or operate dangerous machinery while taking antihistamines.  Do not take these medications if you have prostate enlargement.  Read package instructions thoroughly on all medications that you take.  GET HELP RIGHT AWAY IF:   Symptoms don't go away after treatment.  Severe itching that persists.  If you rash spreads or swells.  If you rash begins to smell.  If it blisters and opens or develops a yellow-brown crust.  You develop a fever.  You have a sore throat.  You become short of breath.  MAKE SURE YOU:  Understand these instructions. Will watch your condition. Will get help right away if you are not doing well or get worse.  Thank you for  choosing an e-visit. Your e-visit answers were reviewed by a board certified advanced clinical practitioner to complete your personal care plan. Depending upon the condition, your plan could have included both over the counter or prescription medications. Please review your pharmacy choice. Be sure that the pharmacy you have chosen is open so that you can pick up your prescription now.  If there is a problem you may message your provider in [place] to have the prescription routed to another pharmacy. Your safety is important to [place]. If you have drug allergies check your prescription carefully.  For the next 24 hours, you can use MyChart to ask questions about today's visit, request a non-urgent call back, or ask for a work or school excuse from your e-visit provider. You will get an email in the next two days asking about your experience. I hope that your e-visit has been valuable and will speed your recovery.     Approximately 5 minutes was used in reviewing the patient's chart, questionnaire, prescribing medications, and documentation.

## 2020-07-12 ENCOUNTER — Ambulatory Visit: Payer: 59 | Admitting: Family Medicine

## 2020-07-12 ENCOUNTER — Other Ambulatory Visit: Payer: Self-pay

## 2020-07-12 ENCOUNTER — Encounter: Payer: Self-pay | Admitting: Family Medicine

## 2020-07-12 VITALS — BP 198/116 | HR 97 | Temp 98.7°F | Ht 72.0 in | Wt 253.0 lb

## 2020-07-12 DIAGNOSIS — I10 Essential (primary) hypertension: Secondary | ICD-10-CM

## 2020-07-12 DIAGNOSIS — R21 Rash and other nonspecific skin eruption: Secondary | ICD-10-CM

## 2020-07-12 MED ORDER — PREDNISONE 20 MG PO TABS: ORAL_TABLET | ORAL | 0 refills | Status: AC

## 2020-07-12 MED ORDER — VALSARTAN 80 MG PO TABS: 80.0000 mg | ORAL_TABLET | Freq: Every day | ORAL | 3 refills | Status: DC

## 2020-07-12 MED ORDER — CLOTRIMAZOLE-BETAMETHASONE 1-0.05 % EX CREA: 1.0000 "application " | TOPICAL_CREAM | Freq: Two times a day (BID) | CUTANEOUS | 1 refills | Status: DC

## 2020-07-12 MED ORDER — HYDROCHLOROTHIAZIDE 25 MG PO TABS: 25.0000 mg | ORAL_TABLET | Freq: Every day | ORAL | 3 refills | Status: DC

## 2020-07-12 NOTE — Patient Instructions (Addendum)
Goal<130/80 Take BP daily Bring log to appointment Follow up in 2 weeks   Hypertension, Adult Hypertension is another name for high blood pressure. High blood pressure forces your heart to work harder to pump blood. This can cause problems over time. There are two numbers in a blood pressure reading. There is a top number (systolic) over a bottom number (diastolic). It is best to have a blood pressure that is below 120/80. Healthy choices can help lower your blood pressure, or you may need medicine to help lower it. What are the causes? The cause of this condition is not known. Some conditions may be related to high blood pressure. What increases the risk?  Smoking.  Having type 2 diabetes mellitus, high cholesterol, or both.  Not getting enough exercise or physical activity.  Being overweight.  Having too much fat, sugar, calories, or salt (sodium) in your diet.  Drinking too much alcohol.  Having long-term (chronic) kidney disease.  Having a family history of high blood pressure.  Age. Risk increases with age.  Race. You may be at higher risk if you are African American.  Gender. Men are at higher risk than women before age 74. After age 52, women are at higher risk than men.  Having obstructive sleep apnea.  Stress. What are the signs or symptoms?  High blood pressure may not cause symptoms. Very high blood pressure (hypertensive crisis) may cause: ? Headache. ? Feelings of worry or nervousness (anxiety). ? Shortness of breath. ? Nosebleed. ? A feeling of being sick to your stomach (nausea). ? Throwing up (vomiting). ? Changes in how you see. ? Very bad chest pain. ? Seizures. How is this treated?  This condition is treated by making healthy lifestyle changes, such as: ? Eating healthy foods. ? Exercising more. ? Drinking less alcohol.  Your health care provider may prescribe medicine if lifestyle changes are not enough to get your blood pressure under  control, and if: ? Your top number is above 130. ? Your bottom number is above 80.  Your personal target blood pressure may vary. Follow these instructions at home: Eating and drinking  If told, follow the DASH eating plan. To follow this plan: ? Fill one half of your plate at each meal with fruits and vegetables. ? Fill one fourth of your plate at each meal with whole grains. Whole grains include whole-wheat pasta, brown rice, and whole-grain bread. ? Eat or drink low-fat dairy products, such as skim milk or low-fat yogurt. ? Fill one fourth of your plate at each meal with low-fat (lean) proteins. Low-fat proteins include fish, chicken without skin, eggs, beans, and tofu. ? Avoid fatty meat, cured and processed meat, or chicken with skin. ? Avoid pre-made or processed food.  Eat less than 1,500 mg of salt each day.  Do not drink alcohol if: ? Your doctor tells you not to drink. ? You are pregnant, may be pregnant, or are planning to become pregnant.  If you drink alcohol: ? Limit how much you use to:  0-1 drink a day for women.  0-2 drinks a day for men. ? Be aware of how much alcohol is in your drink. In the [place] one drink equals one 12 oz bottle of beer (355 mL), one 5 oz glass of wine (148 mL), or one 1 oz glass of hard liquor (44 mL).   Lifestyle  Work with your doctor to stay at a healthy weight or to lose weight. Ask your doctor what  the best weight is for you.  Get at least 30 minutes of exercise most days of the week. This may include walking, swimming, or biking.  Get at least 30 minutes of exercise that strengthens your muscles (resistance exercise) at least 3 days a week. This may include lifting weights or doing Pilates.  Do not use any products that contain nicotine or tobacco, such as cigarettes, e-cigarettes, and chewing tobacco. If you need help quitting, ask your doctor.  Check your blood pressure at home as told by your doctor.  Keep all follow-up visits  as told by your doctor. This is important.   Medicines  Take over-the-counter and prescription medicines only as told by your doctor. Follow directions carefully.  Do not skip doses of blood pressure medicine. The medicine does not work as well if you skip doses. Skipping doses also puts you at risk for problems.  Ask your doctor about side effects or reactions to medicines that you should watch for. Contact a doctor if you:  Think you are having a reaction to the medicine you are taking.  Have headaches that keep coming back (recurring).  Feel dizzy.  Have swelling in your ankles.  Have trouble with your vision. Get help right away if you:  Get a very bad headache.  Start to feel mixed up (confused).  Feel weak or numb.  Feel faint.  Have very bad pain in your: ? Chest. ? Belly (abdomen).  Throw up more than once.  Have trouble breathing. Summary  Hypertension is another name for high blood pressure.  High blood pressure forces your heart to work harder to pump blood.  For most people, a normal blood pressure is less than 120/80.  Making healthy choices can help lower blood pressure. If your blood pressure does not get lower with healthy choices, you may need to take medicine. This information is not intended to replace advice given to you by your health care provider. Make sure you discuss any questions you have with your health care provider. Document Revised: 02/12/2018 Document Reviewed: 02/12/2018 Elsevier Patient Education  2021 [place].   Rash, Adult  A rash is a change in the color of your skin. A rash can also change the way your skin feels. There are many different conditions and factors that can cause a rash. Follow these instructions at home: The goal of treatment is to stop the itching and keep the rash from spreading. Watch for any changes in your symptoms. Let your doctor know about them. Follow these instructions to help with your  condition: Medicine Take or apply over-the-counter and prescription medicines only as told by your doctor. These may include medicines:  To treat red or swollen skin (corticosteroid creams).  To treat itching.  To treat an allergy (oral antihistamines).  To treat very bad symptoms (oral corticosteroids).   Skin care  Put cool cloths (compresses) on the affected areas.  Do not scratch or rub your skin.  Avoid covering the rash. Make sure that the rash is exposed to air as much as possible. Managing itching and discomfort  Avoid hot showers or baths. These can make itching worse. A cold shower may help.  Try taking a bath with: ? Epsom salts. You can get these at your local pharmacy or grocery store. Follow the instructions on the package. ? Baking soda. Pour a small amount into the bath as told by your doctor. ? Colloidal oatmeal. You can get this at your local pharmacy or  grocery store. Follow the instructions on the package.  Try putting baking soda paste onto your skin. Stir water into baking soda until it gets like a paste.  Try putting on a lotion that relieves itchiness (calamine lotion).  Keep cool and out of the sun. Sweating and being hot can make itching worse. General instructions  Rest as needed.  Drink enough fluid to keep your pee (urine) pale yellow.  Wear loose-fitting clothing.  Avoid scented soaps, detergents, and perfumes. Use gentle soaps, detergents, perfumes, and other cosmetic products.  Avoid anything that causes your rash. Keep a journal to help track what causes your rash. Write down: ? What you eat. ? What cosmetic products you use. ? What you drink. ? What you wear. This includes jewelry.  Keep all follow-up visits as told by your doctor. This is important.   Contact a doctor if:  You sweat at night.  You lose weight.  You pee (urinate) more than normal.  You pee less than normal, or you notice that your pee is a darker color than  normal.  You feel weak.  You throw up (vomit).  Your skin or the whites of your eyes look yellow (jaundice).  Your skin: ? Tingles. ? Is numb.  Your rash: ? Does not go away after a few days. ? Gets worse.  You are: ? More thirsty than normal. ? More tired than normal.  You have: ? New symptoms. ? Pain in your belly (abdomen). ? A fever. ? Watery poop (diarrhea). Get help right away if:  You have a fever and your symptoms suddenly get worse.  You start to feel mixed up (confused).  You have a very bad headache or a stiff neck.  You have very bad joint pains or stiffness.  You have jerky movements that you cannot control (seizure).  Your rash covers all or most of your body. The rash may or may not be painful.  You have blisters that: ? Are on top of the rash. ? Grow larger. ? Grow together. ? Are painful. ? Are inside your nose or mouth.  You have a rash that: ? Looks like purple pinprick-sized spots all over your body. ? Has a "bull's eye" or looks like a target. ? Is red and painful, causes your skin to peel, and is not from being in the sun too long. Summary  A rash is a change in the color of your skin. A rash can also change the way your skin feels.  The goal of treatment is to stop the itching and keep the rash from spreading.  Take or apply over-the-counter and prescription medicines only as told by your doctor.  Contact a doctor if you have new symptoms or symptoms that get worse.  Keep all follow-up visits as told by your doctor. This is important. This information is not intended to replace advice given to you by your health care provider. Make sure you discuss any questions you have with your health care provider. Document Revised: 09/26/2018 Document Reviewed: 01/06/2018 Elsevier Patient Education  2021 [employer].    If you have lab work done today you will be contacted with your lab results within the next 2 weeks.  If you have not  heard from [place] then please contact us. The fastest way to get your results is to register for My Chart.   IF you received an x-ray today, you will receive an invoice from [facility]. Please contact [facility] at  [phone] with questions or concerns regarding your invoice.   IF you received labwork today, you will receive an invoice from [facility]. Please contact [facility] at [phone] with questions or concerns regarding your invoice.   Our billing staff will not be able to assist you with questions regarding bills from these companies.  You will be contacted with the lab results as soon as they are available. The fastest way to get your results is to activate your My Chart account. Instructions are located on the last page of this paperwork. If you have not heard from [place] regarding the results in 2 weeks, please contact this office.

## 2020-07-12 NOTE — Progress Notes (Signed)
1/25/20222:53 PM  Lindsay Knox Jul 22, 1975, 45 y.o., female [id number]  Chief Complaint  Patient presents with  . Rash    Arms, thighs, low abd area both sides, legs, started  about a week ago when started taking cyclobenzaprine / naproxen for neck pain - took benadryl and applied cream  . Hypertension    Elevated reading not currently on BP meds    HPI:   Patient is a 45 y.o. female with no significant past medical history who presents today for rash.  Had an e-visit on 07/07/20: Use benadryl q 6 hrs, and muscle relaxer discontinued,  Use hydrocortisone cream  On broth legs and arms and stomach Itch some times  Son diagnosed with leukemia age 48 right before 71 Started with AML then ALL Done with chemo in may  HTN Has BP cuff at home Doesn't regularly take BP Has never been on BP medications  BP Readings from Last 3 Encounters:  07/12/20 (!) 198/116  07/04/20 (!) 169/115  01/19/20 (!) 148/88    Depression screen PHQ 2/9 07/12/2020 01/19/2020 12/22/2019  Decreased Interest 0 0 0  Down, Depressed, Hopeless 0 0 0  PHQ - 2 Score 0 0 0    Fall Risk  07/12/2020 01/19/2020 12/22/2019 02/01/2018 01/01/2018  Falls in the past year? 0 0 0 No No  Number falls in past yr: 0 0 0 - -  Injury with Fall? 0 0 0 - -  Risk for fall due to : - No Fall Risks - - -  Follow up Falls evaluation completed Falls evaluation completed Falls evaluation completed - -     No Known Allergies  Prior to Admission medications   Medication Sig Start Date End Date Taking? Authorizing Provider  cyclobenzaprine (FLEXERIL) 5 MG tablet Take 1 tablet (5 mg total) by mouth 2 (two) times daily as needed for up to 10 days for muscle spasms. 07/04/20 07/14/20  Hall-Potvin, [place], PA-C  naproxen (NAPROSYN) 500 MG tablet Take 1 tablet (500 mg total) by mouth 2 (two) times daily. 07/04/20   Hall-Potvin, [place], PA-C    History reviewed. No pertinent past medical history.  Past Surgical History:  Procedure  Laterality Date  . DILATION AND CURETTAGE OF UTERUS      Social History   Tobacco Use  . Smoking status: Current Every Day Smoker    Packs/day: 0.50    Types: Cigarettes  . Smokeless tobacco: Never Used  Substance Use Topics  . Alcohol use: Yes    Family History  Problem Relation Age of Onset  . Diabetes Father   . Hypertension Father   . Cancer Father        kidney cancer  . Diabetes Brother   . Hypertension Brother   . Hypertension Maternal Grandmother   . Stroke Maternal Grandmother   . Diabetes Paternal Grandmother   . Hypertension Paternal Grandmother   . Leukemia Son   . Breast cancer Maternal Aunt 50    Review of Systems  Constitutional: Negative for chills, fever and malaise/fatigue.  Eyes: Negative for blurred vision and double vision.  Respiratory: Negative for cough, shortness of breath and wheezing.   Cardiovascular: Negative for chest pain, palpitations and leg swelling.  Gastrointestinal: Negative for abdominal pain, blood in stool, constipation, diarrhea, heartburn, nausea and vomiting.  Genitourinary: Negative for dysuria, frequency and hematuria.  Musculoskeletal: Negative for back pain and joint pain.  Skin: Positive for itching and rash.  Neurological: Negative for dizziness, tingling, sensory change, focal  weakness, weakness and headaches.     OBJECTIVE:  Today's Vitals   07/12/20 1414  BP: (!) 198/116  Pulse: 97  Temp: 98.7 F (37.1 C)  SpO2: 99%  Weight: 253 lb (114.8 kg)  Height: 6' (1.829 m)   Body mass index is 34.31 kg/m.   Physical Exam Constitutional:      General: She is not in acute distress.    Appearance: Normal appearance. She is not ill-appearing.  HENT:     Head: Normocephalic.  Cardiovascular:     Rate and Rhythm: Normal rate and regular rhythm.     Pulses: Normal pulses.     Heart sounds: Normal heart sounds. No murmur heard. No friction rub. No gallop.   Pulmonary:     Effort: Pulmonary effort is normal. No  respiratory distress.     Breath sounds: Normal breath sounds. No stridor. No wheezing, rhonchi or rales.  Abdominal:     General: Bowel sounds are normal.     Palpations: Abdomen is soft.     Tenderness: There is no abdominal tenderness.  Musculoskeletal:     Right lower leg: No edema.     Left lower leg: No edema.  Skin:    General: Skin is warm and dry.     Comments: Scattered circular reddened rash on arms, legs and trunk  Neurological:     Mental Status: She is alert and oriented to person, place, and time.  Psychiatric:        Mood and Affect: Mood normal.        Behavior: Behavior normal.     No results found for this or any previous visit (from the past 24 hour(s)).  No results found.   ASSESSMENT and PLAN  Problem List Items Addressed This Visit   None   Visit Diagnoses    Rash and nonspecific skin eruption    -  Primary   Relevant Medications   clotrimazole-betamethasone (LOTRISONE) cream   predniSONE (DELTASONE) 20 MG tablet   Essential hypertension       Relevant Medications   hydrochlorothiazide (HYDRODIURIL) 25 MG tablet   valsartan (DIOVAN) 80 MG tablet      Plan . Start daily valsartan and HCTZ . Take BP daily and bring log to appointment . Prednisone taper and fungal cream for rash, encouraged to follow up if no improvement   Return in about 2 weeks (around 07/26/2020).    Huston Foley [name], FNP-BC Primary Care at [facility] [place], [place] [address] Ph.  [phone] Fax [phone]

## 2020-07-28 ENCOUNTER — Encounter: Payer: Self-pay | Admitting: Family Medicine

## 2020-07-28 ENCOUNTER — Ambulatory Visit: Payer: 59 | Admitting: Family Medicine

## 2020-07-28 ENCOUNTER — Other Ambulatory Visit: Payer: Self-pay

## 2020-07-28 VITALS — BP 132/85 | HR 97 | Temp 98.2°F | Ht 72.0 in | Wt 245.0 lb

## 2020-07-28 DIAGNOSIS — R7303 Prediabetes: Secondary | ICD-10-CM

## 2020-07-28 DIAGNOSIS — I10 Essential (primary) hypertension: Secondary | ICD-10-CM | POA: Diagnosis not present

## 2020-07-28 NOTE — Progress Notes (Signed)
2/10/20224:19 PM  Lindsay Knox Aug 13, 1975, 45 y.o., female [id number]  Chief Complaint  Patient presents with  . Hypertension    Follow up and has home readings     HPI:   Patient is a 45 y.o. female with past medical history significant for HTN and pre-diabetes who presents today for HTN f/u.  Last Ov: Treated for rash with prednisone Rash looks much improved Gone but some darkened spots of skin which are continuing to fade  HTN Has BP cuff at home Doesn't regularly take BP Has never been on BP medications Last OV Started daily valsartan 80 and HCTZ 25 Encouraged to take BP daily and bring log to appointment Home readings 130/80 Home cuff read 10 points higher  BP Readings from Last 3 Encounters:  07/28/20 132/85  07/12/20 (!) 198/116  07/04/20 (!) 169/115    Depression screen PHQ 2/9 07/12/2020 01/19/2020 12/22/2019  Decreased Interest 0 0 0  Down, Depressed, Hopeless 0 0 0  PHQ - 2 Score 0 0 0    Fall Risk  07/28/2020 07/12/2020 01/19/2020 12/22/2019 02/01/2018  Falls in the past year? 0 0 0 0 No  Number falls in past yr: 0 0 0 0 -  Injury with Fall? 0 0 0 0 -  Risk for fall due to : - - No Fall Risks - -  Follow up Falls evaluation completed Falls evaluation completed Falls evaluation completed Falls evaluation completed -     No Known Allergies  Prior to Admission medications   Medication Sig Start Date End Date Taking? Authorizing Provider  cyclobenzaprine (FLEXERIL) 5 MG tablet Take 1 tablet (5 mg total) by mouth 2 (two) times daily as needed for up to 10 days for muscle spasms. 07/04/20 07/14/20  Hall-Potvin, [place], PA-C  naproxen (NAPROSYN) 500 MG tablet Take 1 tablet (500 mg total) by mouth 2 (two) times daily. 07/04/20   Hall-Potvin, [place], PA-C    History reviewed. No pertinent past medical history.  Past Surgical History:  Procedure Laterality Date  . DILATION AND CURETTAGE OF UTERUS      Social History   Tobacco Use  . Smoking status:  Current Every Day Smoker    Packs/day: 0.50    Types: Cigarettes  . Smokeless tobacco: Never Used  Substance Use Topics  . Alcohol use: Yes    Family History  Problem Relation Age of Onset  . Diabetes Father   . Hypertension Father   . Cancer Father        kidney cancer  . Diabetes Brother   . Hypertension Brother   . Hypertension Maternal Grandmother   . Stroke Maternal Grandmother   . Diabetes Paternal Grandmother   . Hypertension Paternal Grandmother   . Leukemia Son   . Breast cancer Maternal Aunt 50    Review of Systems  Constitutional: Negative for malaise/fatigue.  Eyes: Negative for blurred vision and double vision.  Respiratory: Negative for cough, shortness of breath and wheezing.   Cardiovascular: Negative for chest pain, palpitations and leg swelling.  Skin: Negative for itching and rash.  Neurological: Negative for dizziness, tingling, sensory change, focal weakness, weakness and headaches.     OBJECTIVE:  Today's Vitals   07/28/20 1604  BP: 132/85  Pulse: 97  Temp: 98.2 F (36.8 C)  SpO2: 97%  Weight: 245 lb (111.1 kg)  Height: 6' (1.829 m)   Body mass index is 33.23 kg/m.   Physical Exam Constitutional:      General: She is  not in acute distress.    Appearance: Normal appearance. She is not ill-appearing.  HENT:     Head: Normocephalic.  Cardiovascular:     Rate and Rhythm: Normal rate and regular rhythm.     Pulses: Normal pulses.     Heart sounds: Normal heart sounds. No murmur heard. No friction rub. No gallop.   Pulmonary:     Effort: Pulmonary effort is normal. No respiratory distress.     Breath sounds: Normal breath sounds. No stridor. No wheezing, rhonchi or rales.  Abdominal:     General: Bowel sounds are normal.     Palpations: Abdomen is soft.     Tenderness: There is no abdominal tenderness.  Musculoskeletal:     Right lower leg: No edema.     Left lower leg: No edema.  Skin:    General: Skin is warm and dry.   Neurological:     Mental Status: She is alert and oriented to person, place, and time.  Psychiatric:        Mood and Affect: Mood normal.        Behavior: Behavior normal.     No results found for this or any previous visit (from the past 24 hour(s)).  No results found.   ASSESSMENT and PLAN  Problem List Items Addressed This Visit   None   Visit Diagnoses    Essential hypertension    -  Primary   Relevant Orders   Lipid Panel   CMP14+EGFR   Prediabetes       Relevant Orders   Hemoglobin A1c     Plan . Continue home BP monitoring . Continue Valsaratan and HCTZ for goal < 130/80 . Will follow up with labs    Return in about 3 months (around 10/25/2020).    Huston Foley Helon Wisinski, FNP-BC Primary Care at [facility] [place], [place] [address] Ph.  [phone] Fax [phone]

## 2020-07-28 NOTE — Patient Instructions (Addendum)
  Hypertension, Adult Hypertension is another name for high blood pressure. High blood pressure forces your heart to work harder to pump blood. This can cause problems over time. There are two numbers in a blood pressure reading. There is a top number (systolic) over a bottom number (diastolic). It is best to have a blood pressure that is below 120/80. Healthy choices can help lower your blood pressure, or you may need medicine to help lower it. What are the causes? The cause of this condition is not known. Some conditions may be related to high blood pressure. What increases the risk?  Smoking.  Having type 2 diabetes mellitus, high cholesterol, or both.  Not getting enough exercise or physical activity.  Being overweight.  Having too much fat, sugar, calories, or salt (sodium) in your diet.  Drinking too much alcohol.  Having long-term (chronic) kidney disease.  Having a family history of high blood pressure.  Age. Risk increases with age.  Race. You may be at higher risk if you are African American.  Gender. Men are at higher risk than women before age 45. After age 65, women are at higher risk than men.  Having obstructive sleep apnea.  Stress. What are the signs or symptoms?  High blood pressure may not cause symptoms. Very high blood pressure (hypertensive crisis) may cause: ? Headache. ? Feelings of worry or nervousness (anxiety). ? Shortness of breath. ? Nosebleed. ? A feeling of being sick to your stomach (nausea). ? Throwing up (vomiting). ? Changes in how you see. ? Very bad chest pain. ? Seizures. How is this treated?  This condition is treated by making healthy lifestyle changes, such as: ? Eating healthy foods. ? Exercising more. ? Drinking less alcohol.  Your health care provider may prescribe medicine if lifestyle changes are not enough to get your blood pressure under control, and if: ? Your top number is above 130. ? Your bottom number is  above 80.  Your personal target blood pressure may vary. Follow these instructions at home: Eating and drinking  If told, follow the DASH eating plan. To follow this plan: ? Fill one half of your plate at each meal with fruits and vegetables. ? Fill one fourth of your plate at each meal with whole grains. Whole grains include whole-wheat pasta, brown rice, and whole-grain bread. ? Eat or drink low-fat dairy products, such as skim milk or low-fat yogurt. ? Fill one fourth of your plate at each meal with low-fat (lean) proteins. Low-fat proteins include fish, chicken without skin, eggs, beans, and tofu. ? Avoid fatty meat, cured and processed meat, or chicken with skin. ? Avoid pre-made or processed food.  Eat less than 1,500 mg of salt each day.  Do not drink alcohol if: ? Your doctor tells you not to drink. ? You are pregnant, may be pregnant, or are planning to become pregnant.  If you drink alcohol: ? Limit how much you use to:  0-1 drink a day for women.  0-2 drinks a day for men. ? Be aware of how much alcohol is in your drink. In the [place] one drink equals one 12 oz bottle of beer (355 mL), one 5 oz glass of wine (148 mL), or one 1 oz glass of hard liquor (44 mL).   Lifestyle  Work with your doctor to stay at a healthy weight or to lose weight. Ask your doctor what the best weight is for you.  Get at least 30 minutes of   exercise most days of the week. This may include walking, swimming, or biking.  Get at least 30 minutes of exercise that strengthens your muscles (resistance exercise) at least 3 days a week. This may include lifting weights or doing Pilates.  Do not use any products that contain nicotine or tobacco, such as cigarettes, e-cigarettes, and chewing tobacco. If you need help quitting, ask your doctor.  Check your blood pressure at home as told by your doctor.  Keep all follow-up visits as told by your doctor. This is important.   Medicines  Take  over-the-counter and prescription medicines only as told by your doctor. Follow directions carefully.  Do not skip doses of blood pressure medicine. The medicine does not work as well if you skip doses. Skipping doses also puts you at risk for problems.  Ask your doctor about side effects or reactions to medicines that you should watch for. Contact a doctor if you:  Think you are having a reaction to the medicine you are taking.  Have headaches that keep coming back (recurring).  Feel dizzy.  Have swelling in your ankles.  Have trouble with your vision. Get help right away if you:  Get a very bad headache.  Start to feel mixed up (confused).  Feel weak or numb.  Feel faint.  Have very bad pain in your: ? Chest. ? Belly (abdomen).  Throw up more than once.  Have trouble breathing. Summary  Hypertension is another name for high blood pressure.  High blood pressure forces your heart to work harder to pump blood.  For most people, a normal blood pressure is less than 120/80.  Making healthy choices can help lower blood pressure. If your blood pressure does not get lower with healthy choices, you may need to take medicine. This information is not intended to replace advice given to you by your health care provider. Make sure you discuss any questions you have with your health care provider. Document Revised: 02/12/2018 Document Reviewed: 02/12/2018 Elsevier Patient Education  2021 Elsevier Inc.   If you have lab work done today you will be contacted with your lab results within the next 2 weeks.  If you have not heard from us then please contact us. The fastest way to get your results is to register for My Chart.   IF you received an x-ray today, you will receive an invoice from [facility]. Please contact [facility] at [phone] with questions or concerns regarding your invoice.   IF you received labwork today, you will receive an invoice from  [facility]. Please contact [facility] at [phone] with questions or concerns regarding your invoice.   Our billing staff will not be able to assist you with questions regarding bills from these companies.  You will be contacted with the lab results as soon as they are available. The fastest way to get your results is to activate your My Chart account. Instructions are located on the last page of this paperwork. If you have not heard from us regarding the results in 2 weeks, please contact this office.      

## 2020-07-29 LAB — CMP14+EGFR
ALT: 29 IU/L (ref 0–32)
AST: 22 IU/L (ref 0–40)
Albumin/Globulin Ratio: 1.8 (ref 1.2–2.2)
Albumin: 4.8 g/dL (ref 3.8–4.8)
Alkaline Phosphatase: 67 IU/L (ref 44–121)
BUN/Creatinine Ratio: 17 (ref 9–23)
BUN: 14 mg/dL (ref 6–24)
Bilirubin Total: 0.3 mg/dL (ref 0.0–1.2)
CO2: 25 mmol/L (ref 20–29)
Calcium: 9.8 mg/dL (ref 8.7–10.2)
Chloride: 98 mmol/L (ref 96–106)
Creatinine, Ser: 0.81 mg/dL (ref 0.57–1.00)
GFR calc Af Amer: 101 mL/min/{1.73_m2} (ref >59)
GFR calc non Af Amer: 88 mL/min/{1.73_m2} (ref >59)
Globulin, Total: 2.7 g/dL (ref 1.5–4.5)
Glucose: 133 mg/dL — ABNORMAL HIGH (ref 65–99)
Potassium: 3.8 mmol/L (ref 3.5–5.2)
Sodium: 139 mmol/L (ref 134–144)
Total Protein: 7.5 g/dL (ref 6.0–8.5)

## 2020-07-29 LAB — LIPID PANEL
Chol/HDL Ratio: 2.8 ratio (ref 0.0–4.4)
Cholesterol, Total: 228 mg/dL — ABNORMAL HIGH (ref 100–199)
HDL: 81 mg/dL (ref >39)
LDL Chol Calc (NIH): 120 mg/dL — ABNORMAL HIGH (ref 0–99)
Triglycerides: 159 mg/dL — ABNORMAL HIGH (ref 0–149)
VLDL Cholesterol Cal: 27 mg/dL (ref 5–40)

## 2020-07-29 LAB — HEMOGLOBIN A1C
Est. average glucose Bld gHb Est-mCnc: 123 mg/dL
Hgb A1c MFr Bld: 5.9 % — ABNORMAL HIGH (ref 4.8–5.6)

## 2020-07-31 ENCOUNTER — Encounter: Payer: Self-pay | Admitting: Family Medicine

## 2020-07-31 DIAGNOSIS — R7303 Prediabetes: Secondary | ICD-10-CM | POA: Insufficient documentation

## 2020-07-31 DIAGNOSIS — E785 Hyperlipidemia, unspecified: Secondary | ICD-10-CM | POA: Insufficient documentation

## 2020-07-31 DIAGNOSIS — I10 Essential (primary) hypertension: Secondary | ICD-10-CM | POA: Insufficient documentation

## 2021-07-26 ENCOUNTER — Ambulatory Visit (INDEPENDENT_AMBULATORY_CARE_PROVIDER_SITE_OTHER): Payer: 59 | Admitting: Family Medicine

## 2021-07-26 ENCOUNTER — Encounter: Payer: Self-pay | Admitting: Family Medicine

## 2021-07-26 ENCOUNTER — Other Ambulatory Visit: Payer: Self-pay

## 2021-07-26 VITALS — BP 135/89 | HR 69 | Temp 98.1°F | Resp 16 | Ht 72.0 in | Wt 254.0 lb

## 2021-07-26 DIAGNOSIS — Z7689 Persons encountering health services in other specified circumstances: Secondary | ICD-10-CM

## 2021-07-26 DIAGNOSIS — F4321 Adjustment disorder with depressed mood: Secondary | ICD-10-CM

## 2021-07-26 DIAGNOSIS — I10 Essential (primary) hypertension: Secondary | ICD-10-CM

## 2021-07-26 MED ORDER — VALSARTAN-HYDROCHLOROTHIAZIDE 160-25 MG PO TABS: 1.0000 | ORAL_TABLET | Freq: Every day | ORAL | 0 refills | Status: DC

## 2021-07-26 MED ORDER — SERTRALINE HCL 50 MG PO TABS: 50.0000 mg | ORAL_TABLET | Freq: Every day | ORAL | 0 refills | Status: DC

## 2021-07-26 NOTE — Progress Notes (Signed)
Patient is here to establish care with PCP.  Patient is concern  about her BP and would like to talk  about that with provider today

## 2021-07-27 ENCOUNTER — Encounter: Payer: Self-pay | Admitting: Family Medicine

## 2021-07-27 NOTE — Progress Notes (Signed)
New Patient Office Visit  Subjective:  Patient ID: Lindsay Knox, female    DOB: [birth year]  Age: 46 y.o. MRN: [id number]  CC:  Chief Complaint  Patient presents with   Establish Care    HPI ALIENA Knox presents for to establish care and for review of hypertension. Patient also reports tha tshe has had increased social stressors.   No past medical history on file.  Past Surgical History:  Procedure Laterality Date   DILATION AND CURETTAGE OF UTERUS      Family History  Problem Relation Age of Onset   Diabetes Father    Hypertension Father    Cancer Father        kidney cancer   Diabetes Brother    Hypertension Brother    Hypertension Maternal Grandmother    Stroke Maternal Grandmother    Diabetes Paternal Grandmother    Hypertension Paternal Grandmother    Leukemia Son    Breast cancer Maternal Aunt 50    Social History   Socioeconomic History   Marital status: Single    Spouse name: Not on file   Number of children: Not on file   Years of education: Not on file   Highest education level: Not on file  Occupational History   Not on file  Tobacco Use   Smoking status: Every Day    Packs/day: 0.50    Types: Cigarettes   Smokeless tobacco: Never  Vaping Use   Vaping Use: Never used  Substance and Sexual Activity   Alcohol use: Yes   Drug use: No   Sexual activity: Yes    Birth control/protection: I.U.D.  Other Topics Concern   Not on file  Social History Narrative   Not on file   Social Determinants of Health   Financial Resource Strain: Not on file  Food Insecurity: Not on file  Transportation Needs: Not on file  Physical Activity: Not on file  Stress: Not on file  Social Connections: Not on file  Intimate Partner Violence: Not on file    ROS Review of Systems  Psychiatric/Behavioral:  Positive for sleep disturbance. Negative for self-injury and suicidal ideas. The patient is not nervous/anxious.   All other systems reviewed and are  negative.  Objective:   Today's Vitals: BP 135/89    Pulse 69    Temp 98.1 F (36.7 C) (Oral)    Resp 16    Ht 6' (1.829 m)    Wt 254 lb (115.2 kg)    SpO2 97%    BMI 34.45 kg/m   Physical Exam Vitals and nursing note reviewed.  Constitutional:      General: She is not in acute distress. Cardiovascular:     Rate and Rhythm: Normal rate and regular rhythm.  Pulmonary:     Effort: Pulmonary effort is normal.     Breath sounds: Normal breath sounds.  Abdominal:     Palpations: Abdomen is soft.     Tenderness: There is no abdominal tenderness.  Musculoskeletal:     Right lower leg: No edema.     Left lower leg: No edema.  Neurological:     General: No focal deficit present.     Mental Status: She is alert and oriented to person, place, and time.  Psychiatric:        Mood and Affect: Mood is depressed.        Behavior: Behavior normal.    Assessment & Plan:   1. Situational depression Zoloft 50  mg prescribed. Will monitor  2. Essential hypertension Discussed compliance. Meds refilled. monitor  3. Encounter to establish care     Outpatient Encounter Medications as of 07/26/2021  Medication Sig   hydrochlorothiazide (HYDRODIURIL) 25 MG tablet Take 1 tablet (25 mg total) by mouth daily.   sertraline (ZOLOFT) 50 MG tablet Take 1 tablet (50 mg total) by mouth daily.   valsartan (DIOVAN) 80 MG tablet Take 1 tablet (80 mg total) by mouth daily.   valsartan-hydrochlorothiazide (DIOVAN-HCT) 160-25 MG tablet Take 1 tablet by mouth daily.   No facility-administered encounter medications on file as of 07/26/2021.    Follow-up: Return in about 3 months (around 10/23/2021) for physical.   Becky Sax, MD

## 2021-08-24 ENCOUNTER — Ambulatory Visit: Payer: 59 | Admitting: Family Medicine

## 2021-10-23 ENCOUNTER — Encounter: Payer: 59 | Admitting: Family Medicine

## 2021-10-25 ENCOUNTER — Encounter: Payer: 59 | Admitting: Family Medicine

## 2021-10-26 ENCOUNTER — Encounter: Payer: 59 | Admitting: Family Medicine

## 2021-11-02 ENCOUNTER — Encounter: Payer: Self-pay | Admitting: Physician Assistant

## 2021-11-02 ENCOUNTER — Ambulatory Visit (INDEPENDENT_AMBULATORY_CARE_PROVIDER_SITE_OTHER): Payer: 59 | Admitting: Physician Assistant

## 2021-11-02 VITALS — BP 132/87 | HR 78 | Temp 98.7°F | Resp 18 | Ht 72.0 in | Wt 259.0 lb

## 2021-11-02 DIAGNOSIS — I10 Essential (primary) hypertension: Secondary | ICD-10-CM | POA: Diagnosis not present

## 2021-11-02 DIAGNOSIS — R7303 Prediabetes: Secondary | ICD-10-CM | POA: Diagnosis not present

## 2021-11-02 DIAGNOSIS — E782 Mixed hyperlipidemia: Secondary | ICD-10-CM

## 2021-11-02 DIAGNOSIS — F5104 Psychophysiologic insomnia: Secondary | ICD-10-CM | POA: Diagnosis not present

## 2021-11-02 DIAGNOSIS — Z6835 Body mass index (BMI) 35.0-35.9, adult: Secondary | ICD-10-CM

## 2021-11-02 DIAGNOSIS — F4321 Adjustment disorder with depressed mood: Secondary | ICD-10-CM | POA: Diagnosis not present

## 2021-11-02 DIAGNOSIS — Z1211 Encounter for screening for malignant neoplasm of colon: Secondary | ICD-10-CM

## 2021-11-02 LAB — POCT GLYCOSYLATED HEMOGLOBIN (HGB A1C): Hemoglobin A1C: 5.8 % — AB (ref 4.0–5.6)

## 2021-11-02 MED ORDER — VALSARTAN-HYDROCHLOROTHIAZIDE 160-25 MG PO TABS: 1.0000 | ORAL_TABLET | Freq: Every day | ORAL | 1 refills | Status: DC

## 2021-11-02 NOTE — Patient Instructions (Addendum)
I encourage you to work on improving your sleep, I encourage you to aim for 7 hours of sleep at night.  You can try melatonin 5 mg, you will purchase this over-the-counter.  I also encourage you to expose yourself to daylight first thing in the morning to help reset your master clock.  Your A1c is 5.8, still considered pre-diabetes.  I encourage you to reduce added sugars.  We will call you with your lab results when they are available.  Kennieth Rad, PA-C Physician Assistant [facility] [email or web address]   Insomnia Insomnia is a sleep disorder that makes it difficult to fall asleep or stay asleep. Insomnia can cause fatigue, low energy, difficulty concentrating, mood swings, and poor performance at work or school. There are three different ways to classify insomnia: Difficulty falling asleep. Difficulty staying asleep. Waking up too early in the morning. Any type of insomnia can be long-term (chronic) or short-term (acute). Both are common. Short-term insomnia usually lasts for 3 months or less. Chronic insomnia occurs at least three times a week for longer than 3 months. What are the causes? Insomnia may be caused by another condition, situation, or substance, such as: Having certain mental health conditions, such as anxiety and depression. Using caffeine, alcohol, tobacco, or drugs. Having gastrointestinal conditions, such as gastroesophageal reflux disease (GERD). Having certain medical conditions. These include: Asthma. Alzheimer's disease. Stroke. Chronic pain. An overactive thyroid gland (hyperthyroidism). Other sleep disorders, such as restless legs syndrome and sleep apnea. Menopause. Sometimes, the cause of insomnia may not be known. What increases the risk? Risk factors for insomnia include: Gender. Females are affected more often than males. Age. Insomnia is more common as people get older. Stress and certain  medical and mental health conditions. Lack of exercise. Having an irregular work schedule. This may include working night shifts and traveling between different time zones. What are the signs or symptoms? If you have insomnia, the main symptom is having trouble falling asleep or having trouble staying asleep. This may lead to other symptoms, such as: Feeling tired or having low energy. Feeling nervous about going to sleep. Not feeling rested in the morning. Having trouble concentrating. Feeling irritable, anxious, or depressed. How is this diagnosed? This condition may be diagnosed based on: Your symptoms and medical history. Your health care provider may ask about: Your sleep habits. Any medical conditions you have. Your mental health. A physical exam. How is this treated? Treatment for insomnia depends on the cause. Treatment may focus on treating an underlying condition that is causing the insomnia. Treatment may also include: Medicines to help you sleep. Counseling or therapy. Lifestyle adjustments to help you sleep better. Follow these instructions at home: Eating and drinking  Limit or avoid alcohol, caffeinated beverages, and products that contain nicotine and tobacco, especially close to bedtime. These can disrupt your sleep. Do not eat a large meal or eat spicy foods right before bedtime. This can lead to digestive discomfort that can make it hard for you to sleep. Sleep habits  Keep a sleep diary to help you and your health care provider figure out what could be causing your insomnia. Write down: When you sleep. When you wake up during the night. How well you sleep and how rested you feel the next day. Any side effects of medicines you are taking. What you eat and drink. Make your bedroom a dark, comfortable place where it is easy to fall asleep. Put up shades or blackout curtains to block  light from outside. Use a white noise machine to block noise. Keep the  temperature cool. Limit screen use before bedtime. This includes: Not watching TV. Not using your smartphone, tablet, or computer. Stick to a routine that includes going to bed and waking up at the same times every day and night. This can help you fall asleep faster. Consider making a quiet activity, such as reading, part of your nighttime routine. Try to avoid taking naps during the day so that you sleep better at night. Get out of bed if you are still awake after 15 minutes of trying to sleep. Keep the lights down, but try reading or doing a quiet activity. When you feel sleepy, go back to bed. General instructions Take over-the-counter and prescription medicines only as told by your health care provider. Exercise regularly as told by your health care provider. However, avoid exercising in the hours right before bedtime. Use relaxation techniques to manage stress. Ask your health care provider to suggest some techniques that may work well for you. These may include: Breathing exercises. Routines to release muscle tension. Visualizing peaceful scenes. Make sure that you drive carefully. Do not drive if you feel very sleepy. Keep all follow-up visits. This is important. Contact a health care provider if: You are tired throughout the day. You have trouble in your daily routine due to sleepiness. You continue to have sleep problems, or your sleep problems get worse. Get help right away if: You have thoughts about hurting yourself or someone else. Get help right away if you feel like you may hurt yourself or others, or have thoughts about taking your own life. Go to your nearest emergency room or: Call 911. Call the [facility] at [phone] or [phone]. This is open 24 hours a day. Text the Crisis Text Line at (718) 646-7887. Summary Insomnia is a sleep disorder that makes it difficult to fall asleep or stay asleep. Insomnia can be long-term (chronic) or short-term  (acute). Treatment for insomnia depends on the cause. Treatment may focus on treating an underlying condition that is causing the insomnia. Keep a sleep diary to help you and your health care provider figure out what could be causing your insomnia. This information is not intended to replace advice given to you by your health care provider. Make sure you discuss any questions you have with your health care provider. Document Revised: 05/15/2021 Document Reviewed: 05/15/2021 Elsevier Patient Education  [place].

## 2021-11-02 NOTE — Progress Notes (Signed)
Established Patient Office Visit  Subjective   Patient ID: Lindsay Knox, female    DOB: 05/29/1976  Age: 46 y.o. MRN: [id number]  Chief Complaint  Patient presents with   Annual Exam    Presents for medication refills and physical.  States that she does check her blood pressure at home on occasion, it is generally within normal limits.  States that she stopped taking the Zoloft, states that she did not notice any difference, and felt that it was making her sleepy.  States that she did try moving it to bedtime without any relief.  States that she feels she is dealing with her situational depression regarding her sons health as well as she can.  Does endorse that she does not sleep well, states that she wakes up several times throughout the night.  Does endorse reading on her tablet at night.  Has not tried anything for relief.      11/02/2021    3:15 PM 07/26/2021    3:03 PM 07/12/2020    2:16 PM 01/19/2020    3:24 PM 12/22/2019    3:37 PM  Depression screen PHQ 2/9  Decreased Interest 0 0 0 0 0  Down, Depressed, Hopeless 0 0 0 0 0  PHQ - 2 Score 0 0 0 0 0  Altered sleeping 2 0     Tired, decreased energy 1 0     Change in appetite 0 0     Feeling bad or failure about yourself  0 0     Trouble concentrating 0 0     Moving slowly or fidgety/restless 0 0     Suicidal thoughts 0 0     PHQ-9 Score 3 0     Difficult doing work/chores  Not difficult at all         11/02/2021    3:15 PM  GAD 7 : Generalized Anxiety Score  Nervous, Anxious, on Edge 0  Control/stop worrying 3  Worry too much - different things 1  Trouble relaxing 0  Restless 0  Easily annoyed or irritable 0  Afraid - awful might happen 1  Total GAD 7 Score 5      History reviewed. No pertinent past medical history. Social History   Socioeconomic History   Marital status: Single    Spouse name: Not on file   Number of children: Not on file   Years of education: Not on file   Highest education level: Not  on file  Occupational History   Not on file  Tobacco Use   Smoking status: Every Day    Packs/day: 0.50    Types: Cigarettes   Smokeless tobacco: Never  Vaping Use   Vaping Use: Never used  Substance and Sexual Activity   Alcohol use: Yes   Drug use: No   Sexual activity: Yes    Birth control/protection: I.U.D.  Other Topics Concern   Not on file  Social History Narrative   Not on file   Social Determinants of Health   Financial Resource Strain: Not on file  Food Insecurity: Not on file  Transportation Needs: Not on file  Physical Activity: Not on file  Stress: Not on file  Social Connections: Not on file  Intimate Partner Violence: Not on file   Family History  Problem Relation Age of Onset   Diabetes Father    Hypertension Father    Cancer Father        kidney cancer   Diabetes Brother  Hypertension Brother    Hypertension Maternal Grandmother    Stroke Maternal Grandmother    Diabetes Paternal Grandmother    Hypertension Paternal Grandmother    Leukemia Son    Breast cancer Maternal Aunt 50   No Known Allergies    Review of Systems  Constitutional: Negative.   HENT: Negative.    Eyes: Negative.   Respiratory:  Negative for shortness of breath.   Cardiovascular:  Negative for chest pain.  Gastrointestinal: Negative.   Genitourinary: Negative.   Musculoskeletal: Negative.   Skin: Negative.   Neurological: Negative.   Endo/Heme/Allergies: Negative.   Psychiatric/Behavioral:  Negative for depression. The patient has insomnia. The patient is not nervous/anxious.      Objective:     BP 132/87 (BP Location: Right Arm, Patient Position: Sitting, Cuff Size: Large)   Pulse 78   Temp 98.7 F (37.1 C) (Oral)   Resp 18   Ht 6' (1.829 m)   Wt 259 lb (117.5 kg)   SpO2 97%   BMI 35.13 kg/m  BP Readings from Last 3 Encounters:  11/02/21 132/87  07/26/21 135/89  07/28/20 132/85      Physical Exam Vitals and nursing note reviewed.   Constitutional:      Appearance: Normal appearance.  HENT:     Head: Normocephalic.     Right Ear: External ear normal.     Left Ear: External ear normal.     Nose: Nose normal.     Mouth/Throat:     Mouth: Mucous membranes are moist.     Pharynx: Oropharynx is clear.  Eyes:     Extraocular Movements: Extraocular movements intact.     Conjunctiva/sclera: Conjunctivae normal.     Pupils: Pupils are equal, round, and reactive to light.  Cardiovascular:     Rate and Rhythm: Normal rate and regular rhythm.     Pulses: Normal pulses.     Heart sounds: Normal heart sounds.  Pulmonary:     Effort: Pulmonary effort is normal.     Breath sounds: Normal breath sounds.  Musculoskeletal:        General: Normal range of motion.     Cervical back: Normal range of motion and neck supple.  Skin:    General: Skin is warm and dry.  Neurological:     General: No focal deficit present.     Mental Status: She is alert and oriented to person, place, and time.  Psychiatric:        Mood and Affect: Mood normal.        Behavior: Behavior normal.        Thought Content: Thought content normal.        Judgment: Judgment normal.       Assessment & Plan:   Problem List Items Addressed This Visit       Cardiovascular and Mediastinum   Essential hypertension - Primary   Relevant Medications   valsartan-hydrochlorothiazide (DIOVAN-HCT) 160-25 MG tablet   Other Relevant Orders   CBC with Differential/Platelet (Completed)   Comp. Metabolic Panel (12) (Completed)   TSH (Completed)     Other   Hyperlipidemia   Relevant Medications   valsartan-hydrochlorothiazide (DIOVAN-HCT) 160-25 MG tablet   Other Relevant Orders   Lipid panel (Completed)   Prediabetes   Relevant Orders   HgB A1c (Completed)   Situational depression   Psychophysiological insomnia   Other Visit Diagnoses     Screening for colon cancer       Relevant Orders  Ambulatory referral to Gastroenterology   Class 2 severe  obesity due to excess calories with serious comorbidity and body mass index (BMI) of 35.0 to 35.9 in adult ([facility])         1. Essential hypertension Continue current regimen.  Patient encouraged to continue checking blood pressure at home, keeping a written log and having available for all office visits.  Patient to return for fasting labs.  Red flags given for prompt reevaluation - CBC with Differential/Platelet; Future - Comp. Metabolic Panel (12); Future - TSH; Future - valsartan-hydrochlorothiazide (DIOVAN-HCT) 160-25 MG tablet; Take 1 tablet by mouth daily.  Dispense: 90 tablet; Refill: 1  2. Situational depression Controlled  3. Psychophysiological insomnia Patient encouraged to improve sleep hygiene.  Trial melatonin over-the-counter.  Patient education given on supportive care  4. Prediabetes A1C 5.8.  Patient encouraged to work on low sugar diet - HgB A1c  5. Mixed hyperlipidemia  - Lipid panel; Future  6. Screening for colon cancer  - Ambulatory referral to Gastroenterology  7. Class 2 severe obesity due to excess calories with serious comorbidity and body mass index (BMI) of 35.0 to 35.9 in adult [facility])    I have reviewed the patient's medical history (PMH, PSH, Social History, Family History, Medications, and allergies) , and have been updated if relevant. I spent 30 minutes reviewing chart and  face to face time with patient.     Return for needs PAP with Dr Redmond Pulling; needs fasting labs next week.    Loraine Grip Mayers, PA-C

## 2021-11-02 NOTE — Progress Notes (Signed)
Patient has eaten and taken medication today. Patient denies pain at this time.

## 2021-11-03 ENCOUNTER — Other Ambulatory Visit: Payer: 59

## 2021-11-03 DIAGNOSIS — E782 Mixed hyperlipidemia: Secondary | ICD-10-CM

## 2021-11-03 DIAGNOSIS — I10 Essential (primary) hypertension: Secondary | ICD-10-CM

## 2021-11-04 LAB — CBC WITH DIFFERENTIAL/PLATELET
Basophils Absolute: 0 10*3/uL (ref 0.0–0.2)
Basos: 0 %
EOS (ABSOLUTE): 0.1 10*3/uL (ref 0.0–0.4)
Eos: 2 %
Hematocrit: 44.2 % (ref 34.0–46.6)
Hemoglobin: 14.2 g/dL (ref 11.1–15.9)
Immature Grans (Abs): 0 10*3/uL (ref 0.0–0.1)
Immature Granulocytes: 0 %
Lymphocytes Absolute: 3.3 10*3/uL — ABNORMAL HIGH (ref 0.7–3.1)
Lymphs: 40 %
MCH: 27.6 pg (ref 26.6–33.0)
MCHC: 32.1 g/dL (ref 31.5–35.7)
MCV: 86 fL (ref 79–97)
Monocytes Absolute: 0.4 10*3/uL (ref 0.1–0.9)
Monocytes: 5 %
Neutrophils Absolute: 4.3 10*3/uL (ref 1.4–7.0)
Neutrophils: 53 %
Platelets: 279 10*3/uL (ref 150–450)
RBC: 5.15 x10E6/uL (ref 3.77–5.28)
RDW: 13.9 % (ref 11.7–15.4)
WBC: 8.2 10*3/uL (ref 3.4–10.8)

## 2021-11-04 LAB — COMP. METABOLIC PANEL (12)
AST: 15 IU/L (ref 0–40)
Albumin/Globulin Ratio: 1.8 (ref 1.2–2.2)
Albumin: 4.6 g/dL (ref 3.8–4.8)
Alkaline Phosphatase: 42 IU/L — ABNORMAL LOW (ref 44–121)
BUN/Creatinine Ratio: 14 (ref 9–23)
BUN: 11 mg/dL (ref 6–24)
Bilirubin Total: 0.8 mg/dL (ref 0.0–1.2)
Calcium: 9.4 mg/dL (ref 8.7–10.2)
Chloride: 101 mmol/L (ref 96–106)
Creatinine, Ser: 0.76 mg/dL (ref 0.57–1.00)
Globulin, Total: 2.6 g/dL (ref 1.5–4.5)
Glucose: 98 mg/dL (ref 70–99)
Potassium: 4.1 mmol/L (ref 3.5–5.2)
Sodium: 140 mmol/L (ref 134–144)
Total Protein: 7.2 g/dL (ref 6.0–8.5)
eGFR: 97 mL/min/{1.73_m2} (ref >59)

## 2021-11-04 LAB — LIPID PANEL
Chol/HDL Ratio: 2.6 ratio (ref 0.0–4.4)
Cholesterol, Total: 214 mg/dL — ABNORMAL HIGH (ref 100–199)
HDL: 81 mg/dL (ref >39)
LDL Chol Calc (NIH): 118 mg/dL — ABNORMAL HIGH (ref 0–99)
Triglycerides: 88 mg/dL (ref 0–149)
VLDL Cholesterol Cal: 15 mg/dL (ref 5–40)

## 2021-11-04 LAB — TSH: TSH: 2.08 u[IU]/mL (ref 0.450–4.500)

## 2021-11-05 DIAGNOSIS — F4321 Adjustment disorder with depressed mood: Secondary | ICD-10-CM | POA: Insufficient documentation

## 2021-11-05 DIAGNOSIS — F5104 Psychophysiologic insomnia: Secondary | ICD-10-CM | POA: Insufficient documentation

## 2021-11-16 ENCOUNTER — Telehealth: Payer: Self-pay | Admitting: *Deleted

## 2021-11-16 NOTE — Telephone Encounter (Signed)
-----   Message from Kennieth Rad, [place] sent at 11/05/2021  5:42 PM EDT ----- Please call patient and let her know that her thyroid function, kidney and liver function are within normal limits.  She does not show signs of anemia.  Her cholesterol continues to be elevated, her risk of a cardiovascular event in the next 10 years is 3-1/2%.  She does not need to start medication at this time, however it is very important that she follow a low-cholesterol diet and continue to have this monitored.  The 10-year ASCVD risk score (Arnett DK, et al., 2019) is: 3.5%   Values used to calculate the score:     Age: 46 years     Sex: Female     Is Non-Hispanic African American: Yes     Diabetic: No     Tobacco smoker: Yes     Systolic Blood Pressure: 902 mmHg     Is BP treated: Yes     HDL Cholesterol: 81 mg/dL     Total Cholesterol: 214 mg/dL

## 2021-11-16 NOTE — Telephone Encounter (Signed)
Patient verified DOB Patient is aware of results and will follow a low cholesterol diet with a recheck with PCP in 33mhs to a year.

## 2021-12-07 ENCOUNTER — Encounter: Payer: Self-pay | Admitting: Family Medicine

## 2021-12-07 ENCOUNTER — Ambulatory Visit: Payer: 59 | Admitting: Family Medicine

## 2021-12-07 ENCOUNTER — Other Ambulatory Visit (HOSPITAL_COMMUNITY)
Admission: RE | Admit: 2021-12-07 | Discharge: 2021-12-07 | Disposition: A | Discharge status: Home / Self Care | Payer: 59 | Source: Ambulatory Visit | Attending: Family Medicine | Admitting: Family Medicine

## 2021-12-07 VITALS — BP 123/85 | HR 66 | Temp 98.1°F | Resp 16 | Wt 260.8 lb

## 2021-12-07 DIAGNOSIS — Z01419 Encounter for gynecological examination (general) (routine) without abnormal findings: Secondary | ICD-10-CM | POA: Insufficient documentation

## 2021-12-07 DIAGNOSIS — Z113 Encounter for screening for infections with a predominantly sexual mode of transmission: Secondary | ICD-10-CM | POA: Diagnosis present

## 2021-12-07 MED ORDER — MIRTAZAPINE 7.5 MG PO TABS: 7.5000 mg | ORAL_TABLET | Freq: Every day | ORAL | 0 refills | Status: DC

## 2021-12-07 NOTE — Progress Notes (Signed)
Patient is her for annual pap. Patient has no other concerns.

## 2021-12-08 ENCOUNTER — Encounter: Payer: Self-pay | Admitting: Family Medicine

## 2021-12-08 LAB — CERVICOVAGINAL ANCILLARY ONLY
Bacterial Vaginitis (gardnerella): POSITIVE — AB
Candida Glabrata: NEGATIVE
Candida Vaginitis: NEGATIVE
Chlamydia: NEGATIVE
Comment: NEGATIVE
Comment: NEGATIVE
Comment: NEGATIVE
Comment: NEGATIVE
Comment: NEGATIVE
Comment: NORMAL
Neisseria Gonorrhea: NEGATIVE
Trichomonas: NEGATIVE

## 2021-12-12 ENCOUNTER — Encounter: Payer: Self-pay | Admitting: Family Medicine

## 2021-12-12 ENCOUNTER — Other Ambulatory Visit: Payer: Self-pay | Admitting: Family Medicine

## 2021-12-12 MED ORDER — METRONIDAZOLE 500 MG PO TABS: 500.0000 mg | ORAL_TABLET | Freq: Two times a day (BID) | ORAL | 0 refills | Status: AC

## 2021-12-13 NOTE — Telephone Encounter (Signed)
Patient was called and question answer

## 2021-12-14 LAB — CYTOLOGY - PAP
Adequacy: ABSENT
Diagnosis: NEGATIVE

## 2021-12-31 ENCOUNTER — Other Ambulatory Visit: Payer: Self-pay | Admitting: Family Medicine

## 2022-01-08 ENCOUNTER — Ambulatory Visit: Payer: 59 | Admitting: Family Medicine

## 2022-01-08 ENCOUNTER — Encounter: Payer: Self-pay | Admitting: Family Medicine

## 2022-01-08 VITALS — BP 119/77 | HR 76 | Temp 98.0°F | Resp 16 | Wt 267.0 lb

## 2022-01-08 DIAGNOSIS — F5105 Insomnia due to other mental disorder: Secondary | ICD-10-CM

## 2022-01-08 DIAGNOSIS — F4321 Adjustment disorder with depressed mood: Secondary | ICD-10-CM

## 2022-01-08 DIAGNOSIS — I10 Essential (primary) hypertension: Secondary | ICD-10-CM

## 2022-01-08 DIAGNOSIS — F99 Mental disorder, not otherwise specified: Secondary | ICD-10-CM

## 2022-01-08 MED ORDER — VALSARTAN-HYDROCHLOROTHIAZIDE 160-25 MG PO TABS: 1.0000 | ORAL_TABLET | Freq: Every day | ORAL | 1 refills | Status: DC

## 2022-01-08 MED ORDER — MIRTAZAPINE 7.5 MG PO TABS: 7.5000 mg | ORAL_TABLET | Freq: Every day | ORAL | 1 refills | Status: DC

## 2022-01-08 NOTE — Progress Notes (Unsigned)
Patient is here for f/u depression. Patient said that she like that medication gives her a good night rest.

## 2022-01-10 ENCOUNTER — Encounter: Payer: Self-pay | Admitting: Family Medicine

## 2022-01-10 NOTE — Progress Notes (Signed)
Established Patient Office Visit  Subjective    Patient ID: Lindsay Knox, female    DOB: 08/16/1975  Age: 46 y.o. MRN: [id number]  CC:  Chief Complaint  Patient presents with   Depression   Follow-up    HPI LILAH MIJANGOS presents for routine follow up of chronic med issues. Patient denies acute complaints or concerns.    Outpatient Encounter Medications as of 01/08/2022  Medication Sig   mirtazapine (REMERON) 7.5 MG tablet Take 1 tablet (7.5 mg total) by mouth at bedtime.   valsartan-hydrochlorothiazide (DIOVAN-HCT) 160-25 MG tablet Take 1 tablet by mouth daily.   [DISCONTINUED] mirtazapine (REMERON) 7.5 MG tablet Take 1 tablet (7.5 mg total) by mouth at bedtime.   [DISCONTINUED] valsartan-hydrochlorothiazide (DIOVAN-HCT) 160-25 MG tablet Take 1 tablet by mouth daily.   No facility-administered encounter medications on file as of 01/08/2022.    History reviewed. No pertinent past medical history.  Past Surgical History:  Procedure Laterality Date   DILATION AND CURETTAGE OF UTERUS      Family History  Problem Relation Age of Onset   Diabetes Father    Hypertension Father    Cancer Father        kidney cancer   Diabetes Brother    Hypertension Brother    Hypertension Maternal Grandmother    Stroke Maternal Grandmother    Diabetes Paternal Grandmother    Hypertension Paternal Grandmother    Leukemia Son    Breast cancer Maternal Aunt 50    Social History   Socioeconomic History   Marital status: Single    Spouse name: Not on file   Number of children: Not on file   Years of education: Not on file   Highest education level: Not on file  Occupational History   Not on file  Tobacco Use   Smoking status: Every Day    Packs/day: 0.50    Types: Cigarettes   Smokeless tobacco: Never  Vaping Use   Vaping Use: Never used  Substance and Sexual Activity   Alcohol use: Yes   Drug use: No   Sexual activity: Yes    Birth control/protection: I.U.D.  Other  Topics Concern   Not on file  Social History Narrative   Not on file   Social Determinants of Health   Financial Resource Strain: Low Risk  (12/24/2017)   Overall Financial Resource Strain (CARDIA)    Difficulty of Paying Living Expenses: Not hard at all  Food Insecurity: No Food Insecurity (12/24/2017)   Hunger Vital Sign    Worried About Running Out of Food in the Last Year: Never true    Ran Out of Food in the Last Year: Never true  Transportation Needs: No Transportation Needs (12/24/2017)   PRAPARE - Hydrologist (Medical): No    Lack of Transportation (Non-Medical): No  Physical Activity: Inactive (12/24/2017)   Exercise Vital Sign    Days of Exercise per Week: 0 days    Minutes of Exercise per Session: 0 min  Stress: Stress Concern Present (12/24/2017)   [place]    Feeling of Stress : Very much  Social Connections: Somewhat Isolated (12/24/2017)   Social Connection and Isolation Panel [NHANES]    Frequency of Communication with Friends and Family: More than three times a week    Frequency of Social Gatherings with Friends and Family: Once a week    Attends Religious Services: Never  Active Member of Clubs or Organizations: No    Attends Archivist Meetings: Never    Marital Status: Living with partner  Intimate Partner Violence: Not At Risk (12/24/2017)   Humiliation, Afraid, Rape, and Kick questionnaire    Fear of Current or Ex-Partner: No    Emotionally Abused: No    Physically Abused: No    Sexually Abused: No    Review of Systems  All other systems reviewed and are negative.       Objective    BP 119/77   Pulse 76   Temp 98 F (36.7 C) (Oral)   Resp 16   Wt 267 lb (121.1 kg)   SpO2 98%   BMI 36.21 kg/m   Physical Exam Vitals and nursing note reviewed.  Constitutional:      General: She is not in acute distress. Cardiovascular:     Rate and Rhythm:  Normal rate and regular rhythm.  Pulmonary:     Effort: Pulmonary effort is normal.     Breath sounds: Normal breath sounds.  Abdominal:     Palpations: Abdomen is soft.     Tenderness: There is no abdominal tenderness.  Musculoskeletal:     Right lower leg: No edema.     Left lower leg: No edema.  Neurological:     General: No focal deficit present.     Mental Status: She is alert and oriented to person, place, and time.  Psychiatric:        Mood and Affect: Mood normal.        Behavior: Behavior normal.         Assessment & Plan:   1. Situational depression Improved. Continue. meds refilled.   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 Essential hypertension Appears stable with present management. Continue and monitor. Meds refilled.  - valsartan-hydrochlorothiazide (DIOVAN-HCT) 160-25 MG tablet; Take 1 tablet by mouth daily.  Dispense: 90 tablet; Refill: 1    No follow-ups on file.   Becky Sax, MD

## 2022-01-19 ENCOUNTER — Ambulatory Visit (AMBULATORY_SURGERY_CENTER): Payer: 59 | Admitting: *Deleted

## 2022-01-19 VITALS — Ht 72.0 in | Wt 266.0 lb

## 2022-01-19 DIAGNOSIS — Z1211 Encounter for screening for malignant neoplasm of colon: Secondary | ICD-10-CM

## 2022-01-19 MED ORDER — NA SULFATE-K SULFATE-MG SULF 17.5-3.13-1.6 GM/177ML PO SOLN: 1.0000 | Freq: Once | ORAL | 0 refills | Status: AC

## 2022-01-19 NOTE — Progress Notes (Signed)
No egg or soy allergy known to patient  No issues known to pt with past sedation with any surgeries or procedures Patient denies ever being told they had issues or difficulty with intubation  No FH of Malignant Hyperthermia Pt is not on diet pills Pt is not on home 02  Pt is not on blood thinners  Pt denies issues with constipation  No A fib or A flutter Have any cardiac testing pending--NO Pt instructed to use Single[employer]com or GoodRx for a price reduction on prep   

## 2022-01-26 ENCOUNTER — Encounter: Payer: Self-pay | Admitting: Family Medicine

## 2022-02-02 ENCOUNTER — Encounter: Payer: Self-pay | Admitting: Family Medicine

## 2022-02-09 ENCOUNTER — Encounter: Payer: Self-pay | Admitting: Gastroenterology

## 2022-02-20 ENCOUNTER — Telehealth: Payer: Self-pay | Admitting: Gastroenterology

## 2022-02-20 NOTE — Telephone Encounter (Signed)
Spoke with patient & advised that she avoid any further NSAIDs for the week, and contact her PCP for other alternatives if needed and to proceed with procedure as scheduled. Pt verbalized all understanding.

## 2022-02-20 NOTE — Telephone Encounter (Signed)
Left message for patient to call back  

## 2022-02-20 NOTE — Telephone Encounter (Signed)
Pt called and states she pulled a muscle in her shoulder and took milaxicam. She has a procedure Friday and wanted to know if it was ok that she took it, states she took it yesterday morning (9/4). Please advice, thank you

## 2022-02-23 ENCOUNTER — Encounter: Payer: Self-pay | Admitting: Gastroenterology

## 2022-02-23 ENCOUNTER — Ambulatory Visit (AMBULATORY_SURGERY_CENTER): Payer: 59 | Admitting: Gastroenterology

## 2022-02-23 VITALS — BP 150/81 | HR 51 | Temp 98.2°F | Resp 16 | Ht 72.0 in | Wt 266.0 lb

## 2022-02-23 DIAGNOSIS — K5289 Other specified noninfective gastroenteritis and colitis: Secondary | ICD-10-CM

## 2022-02-23 DIAGNOSIS — Z1211 Encounter for screening for malignant neoplasm of colon: Secondary | ICD-10-CM

## 2022-02-23 DIAGNOSIS — D123 Benign neoplasm of transverse colon: Secondary | ICD-10-CM

## 2022-02-23 DIAGNOSIS — K635 Polyp of colon: Secondary | ICD-10-CM

## 2022-02-23 DIAGNOSIS — D122 Benign neoplasm of ascending colon: Secondary | ICD-10-CM

## 2022-02-23 DIAGNOSIS — K529 Noninfective gastroenteritis and colitis, unspecified: Secondary | ICD-10-CM | POA: Diagnosis not present

## 2022-02-23 MED ORDER — SODIUM CHLORIDE 0.9 % IV SOLN: 500.0000 mL | Freq: Once | INTRAVENOUS | Status: DC

## 2022-02-23 NOTE — Patient Instructions (Signed)
HANDOUTS PROVIDED ON: POLYPS, DIVERTICULOSIS  The polyps removed/biopsies taken today have been sent for pathology.  The results can take 1-3 weeks to receive.  When your next colonoscopy should occur will be based on the pathology results.    You may resume your previous diet and medication schedule. AVOID ALL NSAIDS  Thank you for allowing [place] to care for you today!!!   YOU HAD AN ENDOSCOPIC PROCEDURE TODAY AT [facility]:   Refer to the procedure report that was given to you for any specific questions about what was found during the examination.  If the procedure report does not answer your questions, please call your gastroenterologist to clarify.  If you requested that your care partner not be given the details of your procedure findings, then the procedure report has been included in a sealed envelope for you to review at your convenience later.  YOU SHOULD EXPECT: Some feelings of bloating in the abdomen. Passage of more gas than usual.  Walking can help get rid of the air that was put into your GI tract during the procedure and reduce the bloating. If you had a lower endoscopy (such as a colonoscopy or flexible sigmoidoscopy) you may notice spotting of blood in your stool or on the toilet paper. If you underwent a bowel prep for your procedure, you may not have a normal bowel movement for a few days.  Please Note:  You might notice some irritation and congestion in your nose or some drainage.  This is from the oxygen used during your procedure.  There is no need for concern and it should clear up in a day or so.  SYMPTOMS TO REPORT IMMEDIATELY:  Following lower endoscopy (colonoscopy or flexible sigmoidoscopy):  Excessive amounts of blood in the stool  Significant tenderness or worsening of abdominal pains  Swelling of the abdomen that is new, acute  Fever of 100F or higher  For urgent or emergent issues, a gastroenterologist can be reached at any hour by calling [phone]  [phone]. Do not use MyChart messaging for urgent concerns.    DIET:  We do recommend a small meal at first, but then you may proceed to your regular diet.  Drink plenty of fluids but you should avoid alcoholic beverages for 24 hours.  ACTIVITY:  You should plan to take it easy for the rest of today and you should NOT DRIVE or use heavy machinery until tomorrow (because of the sedation medicines used during the test).    FOLLOW UP: Our staff will call the number listed on your records the next business day following your procedure.  We will call around 7:15- 8:00 am to check on you and address any questions or concerns that you may have regarding the information given to you following your procedure. If we do not reach you, we will leave a message.  If you develop any symptoms (ie: fever, flu-like symptoms, shortness of breath, cough etc.) before then, please call [phone].  If you test positive for Covid 19 in the 2 weeks post procedure, please call and report this information to [place].    If any biopsies were taken you will be contacted by phone or by letter within the next 1-3 weeks.  Please call us at [phone] if you have not heard about the biopsies in 3 weeks.    SIGNATURES/CONFIDENTIALITY: You and/or your care partner have signed paperwork which will be entered into your electronic medical record.  These signatures attest to the  fact that that the information above on your After Visit Summary has been reviewed and is understood.  Full responsibility of the confidentiality of this discharge information lies with you and/or your care-partner.

## 2022-02-23 NOTE — Progress Notes (Signed)
   Referring Provider: Dorna Mai, MD Primary Care Physician:  Dorna Mai, MD  Indication for Colonoscopy:  Colon cancer screening   IMPRESSION:  Need for colon cancer screening Appropriate candidate for monitored anesthesia care  PLAN: Colonoscopy in the [facility] today   HPI: Lindsay Knox is a 46 y.o. female presents for screening colonoscopy.  No prior colonoscopy or colon cancer screening.  No known family history of colon cancer or polyps. No family history of uterine/endometrial cancer, pancreatic cancer or gastric/stomach cancer.   Past Medical History:  Diagnosis Date   Anxiety    Depression    SEASONAL   Hyperlipidemia    Hypertension     Past Surgical History:  Procedure Laterality Date   DILATION AND CURETTAGE OF UTERUS      Current Outpatient Medications  Medication Sig Dispense Refill   mirtazapine (REMERON) 7.5 MG tablet Take 1 tablet (7.5 mg total) by mouth at bedtime. 90 tablet 1   valsartan-hydrochlorothiazide (DIOVAN-HCT) 160-25 MG tablet Take 1 tablet by mouth daily. 90 tablet 1   No current facility-administered medications for this visit.    Allergies as of 02/23/2022   (No Known Allergies)    Family History  Problem Relation Age of Onset   Diabetes Father    Hypertension Father    Cancer Father        kidney cancer   Diabetes Brother    Hypertension Brother    Breast cancer Maternal Aunt 67   Hypertension Maternal Grandmother    Stroke Maternal Grandmother    Diabetes Paternal Grandmother    Hypertension Paternal Grandmother    Leukemia Son    Colon cancer Neg Hx    Colon polyps Neg Hx    Crohn's disease Neg Hx    Esophageal cancer Neg Hx    Rectal cancer Neg Hx    Stomach cancer Neg Hx      Physical Exam: General:   Alert,  well-nourished, pleasant and cooperative in NAD Head:  Normocephalic and atraumatic. Eyes:  Sclera clear, no icterus.   Conjunctiva pink. Mouth:  No deformity or lesions.   Neck:  Supple; no  masses or thyromegaly. Lungs:  Clear throughout to auscultation.   No wheezes. Heart:  Regular rate and rhythm; no murmurs. Abdomen:  Soft, non-tender, nondistended, normal bowel sounds, no rebound or guarding.  Msk:  Symmetrical. No boney deformities LAD: No inguinal or umbilical LAD Extremities:  No clubbing or edema. Neurologic:  Alert and  oriented x4;  grossly nonfocal Skin:  No obvious rash or bruise. Psych:  Alert and cooperative. Normal mood and affect.     Studies/Results: No results found.    Shaguana Love L. Tarri Glenn, MD, MPH 02/23/2022, 7:47 AM

## 2022-02-23 NOTE — Op Note (Addendum)
[facility] Patient Name: Lindsay Knox Procedure Date: 02/23/2022 9:03 AM MRN: [id number] Endoscopist: Thornton Park MD, MD Age: 46 Referring MD:  Date of Birth: 1975-07-11 Gender: Female Account #: [id number] Procedure:                Colonoscopy Indications:              Screening for colorectal malignant neoplasm, This                            is the patient's first colonoscopy                           No known family history of colon cancer or polyps Medicines:                Monitored Anesthesia Care Procedure:                Pre-Anesthesia Assessment:                           - Prior to the procedure, a History and Physical                            was performed, and patient medications and                            allergies were reviewed. The patient's tolerance of                            previous anesthesia was also reviewed. The risks                            and benefits of the procedure and the sedation                            options and risks were discussed with the patient.                            All questions were answered, and informed consent                            was obtained. Prior Anticoagulants: The patient has                            taken no previous anticoagulant or antiplatelet                            agents. ASA Grade Assessment: II - A patient with                            mild systemic disease. After reviewing the risks                            and benefits, the patient was deemed in  satisfactory condition to undergo the procedure.                           After obtaining informed consent, the colonoscope                            was passed under direct vision. Throughout the                            procedure, the patient's blood pressure, pulse, and                            oxygen saturations were monitored continuously. The                            CF [id number] #[id number] was  introduced through the anus                            and advanced to the 8 cm into the ileum. A second                            forward view of the right colon was performed. The                            colonoscopy was performed without difficulty. The                            patient tolerated the procedure well. The quality                            of the bowel preparation was excellent. The                            terminal ileum, ileocecal valve, appendiceal                            orifice, and rectum were photographed. Scope In: 9:07:41 AM Scope Out: 9:25:36 AM Scope Withdrawal Time: 0 hours 16 minutes 41 seconds  Total Procedure Duration: 0 hours 17 minutes 55 seconds  Findings:                 The perianal and digital rectal examinations were                            normal.                           Many small and large-mouthed diverticula were found                            in the sigmoid colon, descending colon and                            ascending colon.  Two sessile polyps were found in the transverse                            colon and ascending colon. The polyps were 2 to 3                            mm in size. These polyps were removed with a cold                            snare. Resection and retrieval were complete.                            Estimated blood loss was minimal.                           A patchy area of mildly altered vascular, congested                            and erythematous mucosa was found in the cecum at                            the IC valve and at the appendiceal orifice.                            Biopsies were taken with a cold forceps for                            histology. Estimated blood loss was minimal.                           The remainder of the examined colonic mucosa                            appeared normal. Biopsies were taken with a cold                            forceps for  histology. Estimated blood loss was                            minimal.                           A diffuse area of mucosa in the distal ileum was                            mildly altered vascular, congested and                            erythematous. Biopsies were taken with a cold                            forceps for histology. Estimated blood loss was  minimal.                           The exam was otherwise without abnormality on                            direct and retroflexion views except for internal                            hemorrhoids. Complications:            No immediate complications. Estimated Blood Loss:     Estimated blood loss: none. Impression:               - Diverticulosis in the sigmoid colon, in the                            descending colon and in the ascending colon.                           - Two 2 to 3 mm polyps in the transverse colon and                            in the ascending colon, removed with a cold snare.                            Resected and retrieved.                           - Colitis in the cecum. Biopsied.                           - Ileitis. Biopsied.                           - Altered vascular, congested and erythematous                            mucosa in the distal ileum. Biopsied.                           - The examination was otherwise normal on direct                            and retroflexion views. Recommendation:           - Patient has a contact number available for                            emergencies. The signs and symptoms of potential                            delayed complications were discussed with the                            patient. Return to normal activities tomorrow.  Written discharge instructions were provided to the                            patient.                           - Resume previous diet.                           - Continue present  medications.                           - Await pathology results.                           - Avoid all NSAIDs including ibuprofen (which she                            reported using at the time of colonoscopy).                           - Repeat colonoscopy date to be determined after                            pending pathology results are reviewed for                            surveillance.                           - Emerging evidence supports eating a diet of                            fruits, vegetables, grains, calcium, and yogurt                            while reducing red meat and alcohol may reduce the                            risk of colon cancer.                           - Thank you for allowing me to be involved in your                            colon cancer prevention. Thornton Park MD, MD 02/23/2022 9:34:10 AM This report has been signed electronically.

## 2022-02-23 NOTE — Progress Notes (Signed)
Called to room to assist during endoscopic procedure.  Patient ID and intended procedure confirmed with present staff. Received instructions for my participation in the procedure from the performing physician.  

## 2022-02-23 NOTE — Progress Notes (Signed)
PT taken to PACU. Monitors in place. VSS. Report given to RN. 

## 2022-02-26 ENCOUNTER — Telehealth: Payer: Self-pay

## 2022-02-26 NOTE — Telephone Encounter (Signed)
Attempted f/u call. No answer, VM not setup. 

## 2022-03-10 NOTE — Progress Notes (Signed)
Awaiting addendum. Not present on my review today.

## 2022-04-04 NOTE — Progress Notes (Signed)
Thank you :)

## 2022-04-05 ENCOUNTER — Encounter: Payer: Self-pay | Admitting: Gastroenterology

## 2022-07-09 ENCOUNTER — Other Ambulatory Visit: Payer: Self-pay | Admitting: Family Medicine

## 2022-07-09 DIAGNOSIS — I10 Essential (primary) hypertension: Secondary | ICD-10-CM

## 2022-07-17 ENCOUNTER — Other Ambulatory Visit: Payer: Self-pay | Admitting: Family Medicine

## 2022-07-17 NOTE — Telephone Encounter (Signed)
30 day courtesy refill- needs appt. Sent pt message through [place] [place].  Requested Prescriptions  Pending Prescriptions Disp Refills   mirtazapine (REMERON) 7.5 MG tablet [Pharmacy Med Name: MIRTAZAPINE 7.5 MG TABLET] 30 tablet 0    Sig: TAKE 1 TABLET BY MOUTH AT BEDTIME.     Psychiatry: Antidepressants - mirtazapine Failed - 07/17/2022  1:24 AM      Failed - Valid encounter within last 6 months    Recent Outpatient Visits           6 months ago Situational depression   [facility], MD   7 months ago Encounter for gynecological examination without abnormal finding   [facility], MD   8 months ago Essential hypertension   [facility], Cari S, [place]   11 months ago [place] [facility], MD   1 year ago Essential hypertension   [facility] [place], Laurita Quint, FNP              Passed - Completed PHQ-2 or PHQ-9 in the last 360 days

## 2022-07-31 ENCOUNTER — Other Ambulatory Visit: Payer: Self-pay | Admitting: Family Medicine

## 2022-07-31 DIAGNOSIS — I10 Essential (primary) hypertension: Secondary | ICD-10-CM

## 2022-08-16 ENCOUNTER — Other Ambulatory Visit: Payer: Self-pay | Admitting: Family Medicine

## 2022-08-16 DIAGNOSIS — I10 Essential (primary) hypertension: Secondary | ICD-10-CM

## 2022-08-16 MED ORDER — VALSARTAN-HYDROCHLOROTHIAZIDE 160-25 MG PO TABS: 1.0000 | ORAL_TABLET | Freq: Every day | ORAL | 0 refills | Status: DC

## 2022-08-20 ENCOUNTER — Ambulatory Visit: Payer: 59 | Admitting: Family Medicine

## 2022-08-24 ENCOUNTER — Ambulatory Visit: Payer: 59 | Admitting: Family Medicine

## 2022-08-24 ENCOUNTER — Encounter: Payer: Self-pay | Admitting: Family Medicine

## 2022-08-24 VITALS — BP 119/78 | HR 60 | Temp 98.1°F | Resp 16 | Wt 255.8 lb

## 2022-08-24 DIAGNOSIS — F4321 Adjustment disorder with depressed mood: Secondary | ICD-10-CM | POA: Diagnosis not present

## 2022-08-24 DIAGNOSIS — I10 Essential (primary) hypertension: Secondary | ICD-10-CM

## 2022-08-24 MED ORDER — VALSARTAN-HYDROCHLOROTHIAZIDE 160-25 MG PO TABS: 1.0000 | ORAL_TABLET | Freq: Every day | ORAL | 1 refills | Status: DC

## 2022-08-24 NOTE — Progress Notes (Unsigned)
Patient is here for their 6 month follow-up Patient has no concerns today Care gaps have been discussed with patient  

## 2022-08-27 NOTE — Progress Notes (Signed)
Established Patient Office Visit  Subjective    Patient ID: Lindsay Knox, female    DOB: 1975-08-28  Age: 47 y.o. MRN: [id number]  CC:  Chief Complaint  Patient presents with   Follow-up   Medication Refill    HPI Lindsay Knox presents for routine follow up of chronic med issues. Patient denies acute complaints or concerns.    Outpatient Encounter Medications as of 08/24/2022  Medication Sig   [DISCONTINUED] mirtazapine (REMERON) 7.5 MG tablet TAKE 1 TABLET BY MOUTH EVERYDAY AT BEDTIME   [DISCONTINUED] valsartan-hydrochlorothiazide (DIOVAN-HCT) 160-25 MG tablet Take 1 tablet by mouth daily.   valsartan-hydrochlorothiazide (DIOVAN-HCT) 160-25 MG tablet Take 1 tablet by mouth daily.   [DISCONTINUED] meloxicam (MOBIC) 15 MG tablet Take 15 mg by mouth daily as needed.   No facility-administered encounter medications on file as of 08/24/2022.    Past Medical History:  Diagnosis Date   Anxiety    Depression    SEASONAL   Hyperlipidemia    Hypertension     Past Surgical History:  Procedure Laterality Date   DILATION AND CURETTAGE OF UTERUS      Family History  Problem Relation Age of Onset   Diabetes Father    Hypertension Father    Cancer Father        kidney cancer   Diabetes Brother    Hypertension Brother    Breast cancer Maternal Aunt 85   Hypertension Maternal Grandmother    Stroke Maternal Grandmother    Diabetes Paternal Grandmother    Hypertension Paternal Grandmother    Leukemia Son    Colon cancer Neg Hx    Colon polyps Neg Hx    Crohn's disease Neg Hx    Esophageal cancer Neg Hx    Rectal cancer Neg Hx    Stomach cancer Neg Hx     Social History   Socioeconomic History   Marital status: Single    Spouse name: Not on file   Number of children: Not on file   Years of education: Not on file   Highest education level: Not on file  Occupational History   Not on file  Tobacco Use   Smoking status: Every Day    Packs/day: 0.50    Types:  Cigarettes    Passive exposure: Never   Smokeless tobacco: Never  Vaping Use   Vaping Use: Never used  Substance and Sexual Activity   Alcohol use: Yes    Comment: WEEKENDS   Drug use: No   Sexual activity: Yes    Birth control/protection: I.U.D.  Other Topics Concern   Not on file  Social History Narrative   Not on file   Social Determinants of Health   Financial Resource Strain: Low Risk  (12/24/2017)   Overall Financial Resource Strain (CARDIA)    Difficulty of Paying Living Expenses: Not hard at all  Food Insecurity: No Food Insecurity (12/24/2017)   Hunger Vital Sign    Worried About Running Out of Food in the Last Year: Never true    Ran Out of Food in the Last Year: Never true  Transportation Needs: No Transportation Needs (12/24/2017)   PRAPARE - Hydrologist (Medical): No    Lack of Transportation (Non-Medical): No  Physical Activity: Inactive (12/24/2017)   Exercise Vital Sign    Days of Exercise per Week: 0 days    Minutes of Exercise per Session: 0 min  Stress: Stress Concern Present (12/24/2017)   [place]  [facility]    Feeling of Stress : Very much  Social Connections: Somewhat Isolated (12/24/2017)   Social Connection and Isolation Panel [NHANES]    Frequency of Communication with Friends and Family: More than three times a week    Frequency of Social Gatherings with Friends and Family: Once a week    Attends Religious Services: Never    Marine scientist or Organizations: No    Attends Archivist Meetings: Never    Marital Status: Living with partner  Intimate Partner Violence: Not At Risk (12/24/2017)   Humiliation, Afraid, Rape, and Kick questionnaire    Fear of Current or Ex-Partner: No    Emotionally Abused: No    Physically Abused: No    Sexually Abused: No    Review of Systems  Psychiatric/Behavioral:  Negative for depression. The patient is not  nervous/anxious.   All other systems reviewed and are negative.       Objective    BP 119/78   Pulse 60   Temp 98.1 F (36.7 C) (Oral)   Resp 16   Wt 255 lb 12.8 oz (116 kg)   SpO2 95%   BMI 34.69 kg/m   Physical Exam Vitals and nursing note reviewed.  Constitutional:      General: She is not in acute distress. Cardiovascular:     Rate and Rhythm: Normal rate and regular rhythm.  Pulmonary:     Effort: Pulmonary effort is normal.     Breath sounds: Normal breath sounds.  Abdominal:     Palpations: Abdomen is soft.     Tenderness: There is no abdominal tenderness.  Musculoskeletal:     Right lower leg: No edema.     Left lower leg: No edema.  Neurological:     General: No focal deficit present.     Mental Status: She is alert and oriented to person, place, and time.  Psychiatric:        Mood and Affect: Mood normal.        Behavior: Behavior normal.         Assessment & Plan:   1. Essential hypertension Appears stable. Continue meds refilled.  - valsartan-hydrochlorothiazide (DIOVAN-HCT) 160-25 MG tablet; Take 1 tablet by mouth daily.  Dispense: 90 tablet; Refill: 1  2. Situational depression Patient dc/d meds and appears stable off of meds.    No follow-ups on file.   Becky Sax, MD

## 2023-02-17 ENCOUNTER — Other Ambulatory Visit: Payer: Self-pay | Admitting: Family Medicine

## 2023-02-17 DIAGNOSIS — I10 Essential (primary) hypertension: Secondary | ICD-10-CM

## 2023-02-20 NOTE — Telephone Encounter (Signed)
Requested medication (s) are due for refill today: Due 02/24/23  Requested medication (s) are on the active medication list: yes    Last refill: 08/24/22  #90  1 refill  Future visit scheduled yes 02/25/23  Notes to clinic:Failed due to labs, please review. Thank you.  Requested Prescriptions  Pending Prescriptions Disp Refills   valsartan-hydrochlorothiazide (DIOVAN-HCT) 160-25 MG tablet [Pharmacy Med Name: VALSARTAN-HCTZ 160-25 MG TAB] 90 tablet 1    Sig: TAKE 1 TABLET BY MOUTH EVERY DAY     Cardiovascular: ARB + Diuretic Combos Failed - 02/17/2023  9:11 AM      Failed - K in normal range and within 180 days    Potassium  Date Value Ref Range Status  11/03/2021 4.1 3.5 - 5.2 mmol/L Final         Failed - Na in normal range and within 180 days    Sodium  Date Value Ref Range Status  11/03/2021 140 134 - 144 mmol/L Final         Failed - Cr in normal range and within 180 days    Creatinine, Ser  Date Value Ref Range Status  11/03/2021 0.76 0.57 - 1.00 mg/dL Final         Failed - eGFR is 10 or above and within 180 days    GFR calc Af Amer  Date Value Ref Range Status  07/28/2020 101 >59 mL/min/1.73 Final    Comment:    **In accordance with recommendations from the NKF-ASN Task force,**   [facility] is in the process of updating its eGFR calculation to the   2021 CKD-EPI creatinine equation that estimates kidney function   without a race variable.    GFR calc non Af Amer  Date Value Ref Range Status  07/28/2020 88 >59 mL/min/1.73 Final   eGFR  Date Value Ref Range Status  11/03/2021 97 >59 mL/min/1.73 Final         Passed - Patient is not pregnant      Passed - Last BP in normal range    BP Readings from Last 1 Encounters:  08/24/22 119/78         Passed - Valid encounter within last 6 months    Recent Outpatient Visits           6 months ago Essential hypertension   [facility], [place]   1 year ago Situational  depression   [place] [facility], [place]   1 year ago Encounter for gynecological examination without abnormal finding   [facility], MD   1 year ago Essential hypertension   [place] [facility] Kasandra Knud[place], [place]   1 year ago Situational depression   [place] [facility], MD       Future Appointments             In 5 days Georganna Skeans, MD [facility]

## 2023-02-25 ENCOUNTER — Encounter: Payer: Self-pay | Admitting: Family Medicine

## 2023-02-25 ENCOUNTER — Ambulatory Visit: Payer: 59 | Admitting: Family Medicine

## 2023-02-25 VITALS — BP 136/86 | HR 72 | Temp 98.5°F | Resp 18 | Ht 72.0 in | Wt 256.8 lb

## 2023-02-25 DIAGNOSIS — I10 Essential (primary) hypertension: Secondary | ICD-10-CM

## 2023-02-25 DIAGNOSIS — E782 Mixed hyperlipidemia: Secondary | ICD-10-CM

## 2023-02-25 DIAGNOSIS — R7303 Prediabetes: Secondary | ICD-10-CM

## 2023-02-25 MED ORDER — VALSARTAN-HYDROCHLOROTHIAZIDE 160-25 MG PO TABS: 1.0000 | ORAL_TABLET | Freq: Every day | ORAL | 1 refills | Status: DC

## 2023-02-25 NOTE — Progress Notes (Unsigned)
Follow up b/p medication

## 2023-02-25 NOTE — Progress Notes (Unsigned)
Established Patient Office Visit  Subjective    Patient ID: Lindsay Knox, female    DOB: 1976/01/07  Age: 47 y.o. MRN: [id number]  CC:  Chief Complaint  Patient presents with   Follow-up    B/p medication    HPI Lindsay Knox presents for follow up of chronic med issues. Patient denies acute complaints or concerns.   Outpatient Encounter Medications as of 02/25/2023  Medication Sig   valsartan-hydrochlorothiazide (DIOVAN-HCT) 160-25 MG tablet Take 1 tablet by mouth daily.   [DISCONTINUED] valsartan-hydrochlorothiazide (DIOVAN-HCT) 160-25 MG tablet Take 1 tablet by mouth daily. (Patient not taking: Reported on 02/25/2023)   No facility-administered encounter medications on file as of 02/25/2023.    Past Medical History:  Diagnosis Date   Anxiety    Depression    SEASONAL   Hyperlipidemia    Hypertension     Past Surgical History:  Procedure Laterality Date   DILATION AND CURETTAGE OF UTERUS      Family History  Problem Relation Age of Onset   Diabetes Father    Hypertension Father    Cancer Father        kidney cancer   Diabetes Brother    Hypertension Brother    Breast cancer Maternal Aunt 50   Hypertension Maternal Grandmother    Stroke Maternal Grandmother    Diabetes Paternal Grandmother    Hypertension Paternal Grandmother    Leukemia Son    Colon cancer Neg Hx    Colon polyps Neg Hx    Crohn's disease Neg Hx    Esophageal cancer Neg Hx    Rectal cancer Neg Hx    Stomach cancer Neg Hx     Social History   Socioeconomic History   Marital status: Single    Spouse name: Not on file   Number of children: Not on file   Years of education: Not on file   Highest education level: Not on file  Occupational History   Not on file  Tobacco Use   Smoking status: Every Day    Current packs/day: 0.50    Types: Cigarettes    Passive exposure: Never   Smokeless tobacco: Never  Vaping Use   Vaping status: Never Used  Substance and Sexual Activity    Alcohol use: Yes    Comment: WEEKENDS   Drug use: No   Sexual activity: Yes    Birth control/protection: I.U.D.  Other Topics Concern   Not on file  Social History Narrative   Not on file   Social Determinants of Health   Financial Resource Strain: Low Risk  (02/25/2023)   Overall Financial Resource Strain (CARDIA)    Difficulty of Paying Living Expenses: Not hard at all  Food Insecurity: No Food Insecurity (02/25/2023)   Hunger Vital Sign    Worried About Running Out of Food in the Last Year: Never true    Ran Out of Food in the Last Year: Never true  Transportation Needs: No Transportation Needs (02/25/2023)   PRAPARE - Administrator, Civil Service (Medical): No    Lack of Transportation (Non-Medical): No  Physical Activity: Inactive (02/25/2023)   Exercise Vital Sign    Days of Exercise per Week: 0 days    Minutes of Exercise per Session: 0 min  Stress: No Stress Concern Present (02/25/2023)   [employer] of Occupational Health - Occupational Stress Questionnaire    Feeling of Stress : Not at all  Social Connections: Socially Isolated (02/25/2023)  Social Advertising account executive [NHANES]    Frequency of Communication with Friends and Family: More than three times a week    Frequency of Social Gatherings with Friends and Family: Once a week    Attends Religious Services: Never    Database administrator or Organizations: No    Attends Banker Meetings: Never    Marital Status: Never married  Intimate Partner Violence: Not At Risk (02/25/2023)   Humiliation, Afraid, Rape, and Kick questionnaire    Fear of Current or Ex-Partner: No    Emotionally Abused: No    Physically Abused: No    Sexually Abused: No    Review of Systems  All other systems reviewed and are negative.       Objective    BP 136/86 (BP Location: Right Arm, Patient Position: Sitting, Cuff Size: Large)   Pulse 72   Temp 98.5 F (36.9 C) (Oral)   Resp 18   Ht 6' (1.829  m)   Wt 256 lb 12.8 oz (116.5 kg)   SpO2 97%   BMI 34.83 kg/m   Physical Exam Vitals and nursing note reviewed.  Constitutional:      General: She is not in acute distress. Cardiovascular:     Rate and Rhythm: Normal rate and regular rhythm.  Pulmonary:     Effort: Pulmonary effort is normal.     Breath sounds: Normal breath sounds.  Abdominal:     Palpations: Abdomen is soft.     Tenderness: There is no abdominal tenderness.  Musculoskeletal:     Right lower leg: No edema.     Left lower leg: No edema.  Neurological:     General: No focal deficit present.     Mental Status: She is alert and oriented to person, place, and time.  Psychiatric:        Mood and Affect: Mood normal.        Behavior: Behavior normal.         Assessment & Plan:   Essential hypertension -     Valsartan-hydroCHLOROthiazide; Take 1 tablet by mouth daily.  Dispense: 90 tablet; Refill: 1  Mixed hyperlipidemia  Prediabetes     Return in about 6 months (around 08/25/2023) for physical.   Tommie Raymond, MD

## 2023-02-26 ENCOUNTER — Encounter: Payer: Self-pay | Admitting: Family Medicine

## 2023-08-27 ENCOUNTER — Ambulatory Visit (INDEPENDENT_AMBULATORY_CARE_PROVIDER_SITE_OTHER): Payer: 59 | Admitting: Family Medicine

## 2023-08-27 VITALS — BP 133/84 | HR 67 | Temp 98.2°F | Resp 16 | Ht 72.0 in | Wt 253.0 lb

## 2023-08-27 DIAGNOSIS — Z1159 Encounter for screening for other viral diseases: Secondary | ICD-10-CM

## 2023-08-27 DIAGNOSIS — Z130 Encounter for screening for diseases of the blood and blood-forming organs and certain disorders involving the immune mechanism: Secondary | ICD-10-CM | POA: Diagnosis not present

## 2023-08-27 DIAGNOSIS — Z0000 Encounter for general adult medical examination without abnormal findings: Secondary | ICD-10-CM

## 2023-08-27 DIAGNOSIS — Z114 Encounter for screening for human immunodeficiency virus [HIV]: Secondary | ICD-10-CM | POA: Diagnosis not present

## 2023-08-27 DIAGNOSIS — Z13220 Encounter for screening for lipoid disorders: Secondary | ICD-10-CM | POA: Diagnosis not present

## 2023-08-27 NOTE — Progress Notes (Unsigned)
 -  Patient is here to have annually  complete physical examination  -Care gap address -labs taken

## 2023-08-27 NOTE — Progress Notes (Unsigned)
 Established Patient Office Visit  Subjective    Patient ID: Lindsay Knox, female    DOB: 20-Mar-1976  Age: 47 y.o. MRN: [id number]  CC:  Chief Complaint  Patient presents with   Annual Exam    HPI Lindsay Knox presents for routine annual exam. Patient denies acute complaints.   Outpatient Encounter Medications as of 08/27/2023  Medication Sig   valsartan-hydrochlorothiazide (DIOVAN-HCT) 160-25 MG tablet Take 1 tablet by mouth daily.   No facility-administered encounter medications on file as of 08/27/2023.    Past Medical History:  Diagnosis Date   Anxiety    Depression    SEASONAL   Hyperlipidemia    Hypertension     Past Surgical History:  Procedure Laterality Date   DILATION AND CURETTAGE OF UTERUS      Family History  Problem Relation Age of Onset   Diabetes Father    Hypertension Father    Cancer Father        kidney cancer   Diabetes Brother    Hypertension Brother    Breast cancer Maternal Aunt 50   Hypertension Maternal Grandmother    Stroke Maternal Grandmother    Diabetes Paternal Grandmother    Hypertension Paternal Grandmother    Leukemia Son    Colon cancer Neg Hx    Colon polyps Neg Hx    Crohn's disease Neg Hx    Esophageal cancer Neg Hx    Rectal cancer Neg Hx    Stomach cancer Neg Hx     Social History   Socioeconomic History   Marital status: Single    Spouse name: Not on file   Number of children: Not on file   Years of education: Not on file   Highest education level: 12th grade  Occupational History   Not on file  Tobacco Use   Smoking status: Every Day    Current packs/day: 0.50    Types: Cigarettes    Passive exposure: Never   Smokeless tobacco: Never  Vaping Use   Vaping status: Never Used  Substance and Sexual Activity   Alcohol use: Yes    Comment: WEEKENDS   Drug use: No   Sexual activity: Yes    Birth control/protection: I.U.D.  Other Topics Concern   Not on file  Social History Narrative   Not on file    Social Drivers of Health   Financial Resource Strain: Medium Risk (08/20/2023)   Overall Financial Resource Strain (CARDIA)    Difficulty of Paying Living Expenses: Somewhat hard  Food Insecurity: No Food Insecurity (08/20/2023)   Hunger Vital Sign    Worried About Running Out of Food in the Last Year: Never true    Ran Out of Food in the Last Year: Never true  Transportation Needs: No Transportation Needs (08/20/2023)   PRAPARE - Administrator, Civil Service (Medical): No    Lack of Transportation (Non-Medical): No  Physical Activity: Inactive (08/20/2023)   Exercise Vital Sign    Days of Exercise per Week: 0 days    Minutes of Exercise per Session: 0 min  Stress: Stress Concern Present (08/20/2023)   [employer] of Occupational Health - Occupational Stress Questionnaire    Feeling of Stress : Rather much  Social Connections: Socially Isolated (08/20/2023)   Social Connection and Isolation Panel [NHANES]    Frequency of Communication with Friends and Family: Three times a week    Frequency of Social Gatherings with Friends and Family: Once a week  Attends Religious Services: Never    Active Member of Clubs or Organizations: No    Attends Banker Meetings: Never    Marital Status: Never married  Intimate Partner Violence: Not At Risk (02/25/2023)   Humiliation, Afraid, Rape, and Kick questionnaire    Fear of Current or Ex-Partner: No    Emotionally Abused: No    Physically Abused: No    Sexually Abused: No    Review of Systems  All other systems reviewed and are negative.       Objective    BP 133/84   Pulse 67   Temp 98.2 F (36.8 C) (Oral)   Resp 16   Ht 6' (1.829 m)   Wt 253 lb (114.8 kg)   SpO2 99%   BMI 34.31 kg/m   Physical Exam Vitals and nursing note reviewed.  Constitutional:      General: She is not in acute distress.    Appearance: She is obese.  HENT:     Head: Normocephalic and atraumatic.     Right Ear: Tympanic  membrane, ear canal and external ear normal.     Left Ear: Tympanic membrane, ear canal and external ear normal.     Nose: Nose normal.     Mouth/Throat:     Mouth: Mucous membranes are moist.     Pharynx: Oropharynx is clear.  Eyes:     Conjunctiva/sclera: Conjunctivae normal.     Pupils: Pupils are equal, round, and reactive to light.  Neck:     Thyroid: No thyromegaly.  Cardiovascular:     Rate and Rhythm: Normal rate and regular rhythm.     Heart sounds: Normal heart sounds. No murmur heard. Pulmonary:     Effort: Pulmonary effort is normal. No respiratory distress.     Breath sounds: Normal breath sounds.  Abdominal:     General: There is no distension.     Palpations: Abdomen is soft. There is no mass.     Tenderness: There is no abdominal tenderness.  Musculoskeletal:        General: Normal range of motion.     Cervical back: Normal range of motion and neck supple.  Skin:    General: Skin is warm and dry.  Neurological:     General: No focal deficit present.     Mental Status: She is alert and oriented to person, place, and time.  Psychiatric:        Mood and Affect: Mood normal.        Behavior: Behavior normal.         Assessment & Plan:   Annual physical exam -     CMP14+EGFR  Screening for deficiency anemia -     CBC with Differential/Platelet  Screening for lipid disorders -     Lipid panel  Screening for HIV (human immunodeficiency virus) -     HIV Antibody (routine testing w rflx)  Need for hepatitis C screening test -     Hepatitis C antibody     Return in about 6 months (around 02/27/2024) for follow up, chronic med issues.   Tommie Raymond, MD

## 2023-08-28 ENCOUNTER — Encounter: Payer: Self-pay | Admitting: Family Medicine

## 2023-08-28 ENCOUNTER — Other Ambulatory Visit: Payer: Self-pay | Admitting: Emergency Medicine

## 2023-08-28 ENCOUNTER — Encounter: Payer: Self-pay | Admitting: Emergency Medicine

## 2023-08-28 DIAGNOSIS — I10 Essential (primary) hypertension: Secondary | ICD-10-CM

## 2023-08-28 LAB — CMP14+EGFR
ALT: 21 IU/L (ref 0–32)
AST: 23 IU/L (ref 0–40)
Albumin: 4.2 g/dL (ref 3.9–4.9)
Alkaline Phosphatase: 44 IU/L (ref 44–121)
BUN/Creatinine Ratio: 11 (ref 9–23)
BUN: 8 mg/dL (ref 6–24)
Bilirubin Total: 0.3 mg/dL (ref 0.0–1.2)
CO2: 28 mmol/L (ref 20–29)
Calcium: 9.4 mg/dL (ref 8.7–10.2)
Chloride: 106 mmol/L (ref 96–106)
Creatinine, Ser: 0.76 mg/dL (ref 0.57–1.00)
Globulin, Total: 2 g/dL (ref 1.5–4.5)
Glucose: 94 mg/dL (ref 70–99)
Potassium: 5.2 mmol/L (ref 3.5–5.2)
Sodium: 146 mmol/L — ABNORMAL HIGH (ref 134–144)
Total Protein: 6.2 g/dL (ref 6.0–8.5)
eGFR: 97 mL/min/{1.73_m2} (ref >59)

## 2023-08-28 LAB — CBC WITH DIFFERENTIAL/PLATELET
Basophils Absolute: 0.1 10*3/uL (ref 0.0–0.2)
Basos: 1 %
EOS (ABSOLUTE): 0.2 10*3/uL (ref 0.0–0.4)
Eos: 3 %
Hematocrit: 47.9 % — ABNORMAL HIGH (ref 34.0–46.6)
Hemoglobin: 15.4 g/dL (ref 11.1–15.9)
Immature Grans (Abs): 0 10*3/uL (ref 0.0–0.1)
Immature Granulocytes: 0 %
Lymphocytes Absolute: 3 10*3/uL (ref 0.7–3.1)
Lymphs: 44 %
MCH: 28.7 pg (ref 26.6–33.0)
MCHC: 32.2 g/dL (ref 31.5–35.7)
MCV: 89 fL (ref 79–97)
Monocytes Absolute: 0.4 10*3/uL (ref 0.1–0.9)
Monocytes: 6 %
Neutrophils Absolute: 3.2 10*3/uL (ref 1.4–7.0)
Neutrophils: 46 %
Platelets: 306 10*3/uL (ref 150–450)
RBC: 5.37 x10E6/uL — ABNORMAL HIGH (ref 3.77–5.28)
RDW: 13.1 % (ref 11.7–15.4)
WBC: 6.9 10*3/uL (ref 3.4–10.8)

## 2023-08-28 LAB — LIPID PANEL
Chol/HDL Ratio: 2.5 ratio (ref 0.0–4.4)
Cholesterol, Total: 206 mg/dL — ABNORMAL HIGH (ref 100–199)
HDL: 82 mg/dL (ref >39)
LDL Chol Calc (NIH): 101 mg/dL — ABNORMAL HIGH (ref 0–99)
Triglycerides: 135 mg/dL (ref 0–149)
VLDL Cholesterol Cal: 23 mg/dL (ref 5–40)

## 2023-08-28 LAB — HIV ANTIBODY (ROUTINE TESTING W REFLEX): HIV Screen 4th Generation wRfx: NONREACTIVE

## 2023-08-28 LAB — HEPATITIS C ANTIBODY: Hep C Virus Ab: NONREACTIVE

## 2023-08-28 MED ORDER — VALSARTAN-HYDROCHLOROTHIAZIDE 160-25 MG PO TABS: 1.0000 | ORAL_TABLET | Freq: Every day | ORAL | 1 refills | Status: DC

## 2023-10-14 ENCOUNTER — Other Ambulatory Visit: Payer: Self-pay | Admitting: Family Medicine

## 2023-10-14 ENCOUNTER — Telehealth: Payer: Self-pay | Admitting: *Deleted

## 2023-10-14 ENCOUNTER — Encounter: Payer: Self-pay | Admitting: *Deleted

## 2023-10-14 MED ORDER — AZITHROMYCIN 500 MG PO TABS: 500.0000 mg | ORAL_TABLET | Freq: Every day | ORAL | 0 refills | Status: DC

## 2023-10-14 NOTE — Telephone Encounter (Signed)
 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

## 2024-01-09 ENCOUNTER — Ambulatory Visit

## 2024-01-13 ENCOUNTER — Ambulatory Visit (INDEPENDENT_AMBULATORY_CARE_PROVIDER_SITE_OTHER): Admitting: *Deleted

## 2024-01-13 DIAGNOSIS — Z111 Encounter for screening for respiratory tuberculosis: Secondary | ICD-10-CM | POA: Diagnosis not present

## 2024-01-13 NOTE — Progress Notes (Signed)
 Patient is in office today for a nurse visit for PPD. Patient Injection was given in the  Right arm. Patient tolerated injection well.

## 2024-01-13 NOTE — Addendum Note (Signed)
 Addended by: CELESTIA GUSTAV BRAVO on: 01/13/2024 09:54 AM   Modules accepted: Orders

## 2024-01-15 ENCOUNTER — Telehealth: Payer: Self-pay

## 2024-01-15 ENCOUNTER — Ambulatory Visit

## 2024-01-15 LAB — TB SKIN TEST
Induration: 0 mm
TB Skin Test: NEGATIVE

## 2024-01-15 NOTE — Telephone Encounter (Signed)
 Created in error

## 2024-02-21 LAB — HM MAMMOGRAPHY

## 2024-02-25 ENCOUNTER — Ambulatory Visit: Admitting: Family Medicine

## 2024-02-25 ENCOUNTER — Encounter: Payer: Self-pay | Admitting: Family Medicine

## 2024-02-25 VITALS — BP 136/92 | HR 90 | Ht 72.0 in | Wt 253.0 lb

## 2024-02-25 DIAGNOSIS — F17200 Nicotine dependence, unspecified, uncomplicated: Secondary | ICD-10-CM

## 2024-02-25 DIAGNOSIS — E782 Mixed hyperlipidemia: Secondary | ICD-10-CM

## 2024-02-25 DIAGNOSIS — I10 Essential (primary) hypertension: Secondary | ICD-10-CM

## 2024-02-25 MED ORDER — VALSARTAN-HYDROCHLOROTHIAZIDE 160-25 MG PO TABS: 1.0000 | ORAL_TABLET | Freq: Every day | ORAL | 1 refills | Status: AC

## 2024-02-25 NOTE — Progress Notes (Unsigned)
 Established Patient Office Visit  Subjective    Patient ID: Lindsay Knox, female    DOB: 04/21/1976  Age: 48 y.o. MRN: [id number]  CC:  Chief Complaint  Patient presents with   Medical Management of Chronic Issues    HPI Lindsay Knox presents for routine follow up of chronic med issues including hypertension. Patient reports med compliance and denies acute complaints.   Outpatient Encounter Medications as of 02/25/2024  Medication Sig   azithromycin  (ZITHROMAX ) 500 MG tablet Take 1 tablet (500 mg total) by mouth daily.   [DISCONTINUED] valsartan -hydrochlorothiazide  (DIOVAN -HCT) 160-25 MG tablet Take 1 tablet by mouth daily.   valsartan -hydrochlorothiazide  (DIOVAN -HCT) 160-25 MG tablet Take 1 tablet by mouth daily.   No facility-administered encounter medications on file as of 02/25/2024.    Past Medical History:  Diagnosis Date   Anxiety    Depression    SEASONAL   Hyperlipidemia    Hypertension     Past Surgical History:  Procedure Laterality Date   DILATION AND CURETTAGE OF UTERUS      Family History  Problem Relation Age of Onset   Diabetes Father    Hypertension Father    Cancer Father        kidney cancer   Diabetes Brother    Hypertension Brother    Breast cancer Maternal Aunt 50   Hypertension Maternal Grandmother    Stroke Maternal Grandmother    Diabetes Paternal Grandmother    Hypertension Paternal Grandmother    Leukemia Son    Colon cancer Neg Hx    Colon polyps Neg Hx    Crohn's disease Neg Hx    Esophageal cancer Neg Hx    Rectal cancer Neg Hx    Stomach cancer Neg Hx     Social History   Socioeconomic History   Marital status: Single    Spouse name: Not on file   Number of children: Not on file   Years of education: Not on file   Highest education level: 12th grade  Occupational History   Not on file  Tobacco Use   Smoking status: Every Day    Current packs/day: 0.50    Types: Cigarettes    Passive exposure: Never   Smokeless  tobacco: Never  Vaping Use   Vaping status: Never Used  Substance and Sexual Activity   Alcohol use: Yes    Comment: WEEKENDS   Drug use: No   Sexual activity: Yes    Birth control/protection: I.U.D.  Other Topics Concern   Not on file  Social History Narrative   Not on file   Social Drivers of Health   Financial Resource Strain: Medium Risk (02/24/2024)   Overall Financial Resource Strain (CARDIA)    Difficulty of Paying Living Expenses: Somewhat hard  Food Insecurity: No Food Insecurity (02/24/2024)   Hunger Vital Sign    Worried About Running Out of Food in the Last Year: Never true    Ran Out of Food in the Last Year: Never true  Transportation Needs: No Transportation Needs (02/24/2024)   PRAPARE - Administrator, Civil Service (Medical): No    Lack of Transportation (Non-Medical): No  Physical Activity: Inactive (02/24/2024)   Exercise Vital Sign    Days of Exercise per Week: 0 days    Minutes of Exercise per Session: Not on file  Stress: Stress Concern Present (02/24/2024)   [employer] of Occupational Health - Occupational Stress Questionnaire    Feeling of Stress: To  some extent  Social Connections: Moderately Isolated (02/24/2024)   Social Connection and Isolation Panel    Frequency of Communication with Friends and Family: Three times a week    Frequency of Social Gatherings with Friends and Family: Twice a week    Attends Religious Services: 1 to 4 times per year    Active Member of [employer] or Organizations: No    Attends Engineer, structural: Not on file    Marital Status: Never married  Intimate Partner Violence: Not At Risk (02/25/2023)   Humiliation, Afraid, Rape, and Kick questionnaire    Fear of Current or Ex-Partner: No    Emotionally Abused: No    Physically Abused: No    Sexually Abused: No    Review of Systems  All other systems reviewed and are negative.       Objective    BP (!) 136/92   Pulse 90   Ht 6' (1.829 m)   Wt  253 lb (114.8 kg)   SpO2 96%   BMI 34.31 kg/m   Physical Exam Vitals and nursing note reviewed.  Constitutional:      General: She is not in acute distress.    Appearance: She is obese.  Cardiovascular:     Rate and Rhythm: Normal rate and regular rhythm.  Pulmonary:     Effort: Pulmonary effort is normal.     Breath sounds: Normal breath sounds.  Abdominal:     Palpations: Abdomen is soft.     Tenderness: There is no abdominal tenderness.  Neurological:     General: No focal deficit present.     Mental Status: She is alert and oriented to person, place, and time.  Psychiatric:        Mood and Affect: Mood normal.        Behavior: Behavior normal.         Assessment & Plan:   1. Essential hypertension (Primary) Slightly elevated readings. Discussed compliance. Continue  - valsartan -hydrochlorothiazide  (DIOVAN -HCT) 160-25 MG tablet; Take 1 tablet by mouth daily.  Dispense: 90 tablet; Refill: 1  2. Mixed hyperlipidemia Continue   3. Smoker Discussed reduction/cessation    Return in about 6 months (around 08/24/2024) for physical.   Tanda Raguel SQUIBB, MD

## 2024-02-26 ENCOUNTER — Encounter: Payer: Self-pay | Admitting: Family Medicine

## 2024-08-24 ENCOUNTER — Encounter: Admitting: Family Medicine
